# Patient Record
Sex: Female | Born: 1935 | Race: White | Hispanic: No | Smoking: Former smoker
Health system: Southern US, Community
[De-identification: ages and names within clinical notes are randomized; demographics above are authoritative.]

## PROBLEM LIST (undated history)

## (undated) DIAGNOSIS — R238 Other skin changes: Secondary | ICD-10-CM

## (undated) DIAGNOSIS — Z9889 Other specified postprocedural states: Secondary | ICD-10-CM

## (undated) HISTORY — DX: Other specified postprocedural states: Z98.890

## (undated) HISTORY — DX: Other skin changes: R23.8

---

## 1998-02-22 HISTORY — PX: COLONOSCOPY: SHX174

## 1998-02-23 LAB — HM COLONOSCOPY: HM COLON: NORMAL

## 2006-02-22 DIAGNOSIS — Z9889 Other specified postprocedural states: Secondary | ICD-10-CM

## 2006-02-22 HISTORY — PX: EXPLORATORY LAPAROTOMY: SUR591

## 2006-02-22 HISTORY — DX: Other specified postprocedural states: Z98.890

## 2006-12-14 ENCOUNTER — Ambulatory Visit: Payer: Self-pay | Admitting: Emergency Medicine

## 2010-11-24 ENCOUNTER — Ambulatory Visit: Payer: Self-pay | Admitting: Internal Medicine

## 2011-05-30 ENCOUNTER — Ambulatory Visit: Payer: Self-pay | Admitting: Internal Medicine

## 2011-09-25 ENCOUNTER — Ambulatory Visit: Payer: Self-pay | Admitting: Internal Medicine

## 2012-03-03 DIAGNOSIS — M19019 Primary osteoarthritis, unspecified shoulder: Secondary | ICD-10-CM | POA: Insufficient documentation

## 2012-12-17 ENCOUNTER — Ambulatory Visit: Payer: Self-pay | Admitting: Family Medicine

## 2014-03-07 LAB — CBC AND DIFFERENTIAL: Hemoglobin: 12.4 g/dL (ref 12.0–16.0)

## 2014-03-07 LAB — BASIC METABOLIC PANEL
BUN: 13 mg/dL (ref 4–21)
CREATININE: 0.6 mg/dL (ref <=1.1)

## 2014-03-07 LAB — TSH: TSH: 1.6 u[IU]/mL (ref <=5.90)

## 2014-08-23 ENCOUNTER — Encounter: Payer: Self-pay | Admitting: Internal Medicine

## 2014-08-23 DIAGNOSIS — F17201 Nicotine dependence, unspecified, in remission: Secondary | ICD-10-CM | POA: Insufficient documentation

## 2014-08-23 DIAGNOSIS — M549 Dorsalgia, unspecified: Secondary | ICD-10-CM

## 2014-08-23 DIAGNOSIS — K5909 Other constipation: Secondary | ICD-10-CM | POA: Insufficient documentation

## 2014-08-23 DIAGNOSIS — N3941 Urge incontinence: Secondary | ICD-10-CM | POA: Insufficient documentation

## 2014-08-23 DIAGNOSIS — M159 Polyosteoarthritis, unspecified: Secondary | ICD-10-CM | POA: Insufficient documentation

## 2014-08-23 DIAGNOSIS — M810 Age-related osteoporosis without current pathological fracture: Secondary | ICD-10-CM | POA: Insufficient documentation

## 2014-08-23 DIAGNOSIS — M109 Gout, unspecified: Secondary | ICD-10-CM | POA: Insufficient documentation

## 2014-08-23 DIAGNOSIS — I10 Essential (primary) hypertension: Secondary | ICD-10-CM | POA: Insufficient documentation

## 2014-08-23 DIAGNOSIS — G8929 Other chronic pain: Secondary | ICD-10-CM | POA: Insufficient documentation

## 2014-08-23 DIAGNOSIS — K219 Gastro-esophageal reflux disease without esophagitis: Secondary | ICD-10-CM | POA: Insufficient documentation

## 2014-08-23 DIAGNOSIS — F411 Generalized anxiety disorder: Secondary | ICD-10-CM | POA: Insufficient documentation

## 2014-08-27 ENCOUNTER — Ambulatory Visit (INDEPENDENT_AMBULATORY_CARE_PROVIDER_SITE_OTHER): Payer: Medicare Other | Admitting: Internal Medicine

## 2014-08-27 ENCOUNTER — Encounter: Payer: Self-pay | Admitting: Internal Medicine

## 2014-08-27 VITALS — BP 122/64 | HR 60 | Ht 60.0 in | Wt 125.0 lb

## 2014-08-27 DIAGNOSIS — G2581 Restless legs syndrome: Secondary | ICD-10-CM | POA: Diagnosis not present

## 2014-08-27 MED ORDER — ROPINIROLE HCL 1 MG PO TABS: 1.0000 mg | ORAL_TABLET | Freq: Every day | ORAL | Status: DC

## 2014-08-27 NOTE — Progress Notes (Signed)
Date:  08/27/2014   Name:  Joan Singleton   DOB:  14-Sep-1924   MRN:  [id number]   Chief Complaint: No chief complaint on file. Leg Pain  The incident occurred more than 1 week ago. There was no injury mechanism. The pain is present in the left hip. The quality of the pain is described as aching (has twitching in leg when she tries to sleep). The patient is experiencing no pain. The pain has been constant since onset. Exacerbated by: lying down. The treatment provided no relief.  She has the sensation of needing to move her left leg and has to get up and walk.  Usually lasts about an hour before she gets relief.  It does not wake her up from sleep.   Review of Systems:  Review of Systems  Constitutional: Negative for fever and fatigue.  Respiratory: Negative for cough and shortness of breath.   Cardiovascular: Negative for chest pain, palpitations and leg swelling.  Musculoskeletal: Positive for arthralgias and gait problem. Negative for back pain and joint swelling.  Neurological: Negative for dizziness.  Hematological: Negative for adenopathy. Does not bruise/bleed easily.  Psychiatric/Behavioral: Positive for sleep disturbance. The patient is nervous/anxious.     Patient Active Problem List   Diagnosis Date Noted  . Arthritis urica 08/23/2014  . Back pain, chronic 08/23/2014  . Chronic constipation 08/23/2014  . Essential (primary) hypertension 08/23/2014  . Acid reflux 08/23/2014  . Anxiety, generalized 08/23/2014  . Generalized OA 08/23/2014  . OP (osteoporosis) 08/23/2014  . Urge incontinence 08/23/2014  . Compulsive tobacco user syndrome 08/23/2014  . Arthritis of shoulder region, degenerative 03/03/2012  . Arthritis of knee 02/25/2012    Prior to Admission medications   Medication Sig Start Date End Date Taking? Authorizing Provider  amLODipine (NORVASC) 5 MG tablet Take 1 tablet by mouth daily. 08/14/14  Yes Historical Provider, MD  atenolol (TENORMIN) 50 MG tablet Take  1 tablet by mouth daily. 08/14/14  Yes Historical Provider, MD  citalopram (CELEXA) 20 MG tablet Take 1 tablet by mouth daily. 08/14/14  Yes Historical Provider, MD  lisinopril-hydrochlorothiazide (PRINZIDE,ZESTORETIC) 20-25 MG per tablet Take 1 tablet by mouth daily. 08/14/14  Yes Historical Provider, MD  LORazepam (ATIVAN) 0.5 MG tablet Take 1 tablet by mouth 3 (three) times daily. 08/03/14  Yes Historical Provider, MD  omeprazole (PRILOSEC) 20 MG capsule Take 1 capsule by mouth daily. 05/30/14  Yes Historical Provider, MD  oxybutynin (DITROPAN) 5 MG tablet Take 1 tablet by mouth 3 (three) times daily. 05/30/14  Yes Historical Provider, MD  traMADol Janean Sark(ULTRAM) 50 MG tablet Take 2 tablets by mouth QID. 06/14/14  Yes Historical Provider, MD    No Known Allergies  Past Surgical History  Procedure Laterality Date  . Exploratory laparotomy  2008    adhesions  . Colonoscopy  2000    normal    History  Substance Use Topics  . Smoking status: Former Games developermoker  . Smokeless tobacco: Not on file  . Alcohol Use: 1.2 oz/week    2 Standard drinks or equivalent per week     Medication list has been reviewed and updated.  Physical Examination:  Physical Exam  Constitutional: She appears well-developed and well-nourished. No distress.  HENT:  Right Ear: Decreased hearing is noted.  Left Ear: Decreased hearing is noted.  Eyes: Conjunctivae are normal. No scleral icterus.  Neck: Normal range of motion. Neck supple. No thyromegaly present.  Cardiovascular: Normal rate, regular rhythm and normal heart sounds.  Pulmonary/Chest: Effort normal and breath sounds normal. She has no wheezes.  Musculoskeletal:       Right hip: She exhibits no tenderness, no bony tenderness and no swelling.       Left hip: She exhibits no tenderness, no bony tenderness and no swelling.       Right knee: She exhibits normal range of motion. No tenderness found.       Left knee: She exhibits normal range of motion.        Legs: No myalgia to palpation of either thigh or calf bilaterally  Lymphadenopathy:    She has no cervical adenopathy.  Psychiatric: She has a normal mood and affect. Her speech is normal.    BP 122/64 mmHg  Pulse 60  Ht 5' (1.524 m)  Wt 125 lb (56.7 kg)  BMI 24.41 kg/m2  Assessment and Plan: 1. Restless leg syndrome Begin medication 3 hours before bedtime Can increase dose if needed Call for mail order Rx if helpful - rOPINIRole (REQUIP) 1 MG tablet; Take 1 tablet (1 mg total) by mouth at bedtime.  Dispense: 30 tablet; Refill: 1   Bari Edward, MD [facility]  08/27/2014

## 2014-09-05 ENCOUNTER — Other Ambulatory Visit: Payer: Self-pay | Admitting: Internal Medicine

## 2014-10-18 ENCOUNTER — Other Ambulatory Visit: Payer: Self-pay | Admitting: Internal Medicine

## 2014-11-05 ENCOUNTER — Other Ambulatory Visit: Payer: Self-pay | Admitting: Internal Medicine

## 2014-11-22 ENCOUNTER — Other Ambulatory Visit: Payer: Self-pay | Admitting: Internal Medicine

## 2014-11-29 ENCOUNTER — Other Ambulatory Visit: Payer: Self-pay | Admitting: Internal Medicine

## 2014-12-16 ENCOUNTER — Other Ambulatory Visit: Payer: Self-pay

## 2014-12-16 MED ORDER — MECLIZINE HCL 25 MG PO TABS: 25.0000 mg | ORAL_TABLET | Freq: Three times a day (TID) | ORAL | Status: DC | PRN

## 2015-01-27 ENCOUNTER — Other Ambulatory Visit: Payer: Self-pay

## 2015-01-27 DIAGNOSIS — R0789 Other chest pain: Secondary | ICD-10-CM | POA: Insufficient documentation

## 2015-01-27 DIAGNOSIS — R0602 Shortness of breath: Secondary | ICD-10-CM | POA: Insufficient documentation

## 2015-01-27 DIAGNOSIS — Z8781 Personal history of (healed) traumatic fracture: Secondary | ICD-10-CM | POA: Insufficient documentation

## 2015-02-05 ENCOUNTER — Inpatient Hospital Stay: Payer: Medicare Other | Admitting: Internal Medicine

## 2015-02-06 ENCOUNTER — Other Ambulatory Visit: Payer: Self-pay | Admitting: Internal Medicine

## 2015-02-10 ENCOUNTER — Encounter: Payer: Self-pay | Admitting: Internal Medicine

## 2015-02-10 ENCOUNTER — Ambulatory Visit (INDEPENDENT_AMBULATORY_CARE_PROVIDER_SITE_OTHER): Payer: Medicare Other | Admitting: Internal Medicine

## 2015-02-10 VITALS — BP 110/68 | HR 64 | Ht 60.0 in

## 2015-02-10 DIAGNOSIS — R0789 Other chest pain: Secondary | ICD-10-CM | POA: Diagnosis not present

## 2015-02-10 DIAGNOSIS — M159 Polyosteoarthritis, unspecified: Secondary | ICD-10-CM | POA: Diagnosis not present

## 2015-02-10 NOTE — Progress Notes (Signed)
Date:  02/10/2015   Name:  Joan Singleton   DOB:  [birth year]   MRN:  [id number]   Chief Complaint: Hospitalization Follow-up Patient presents for hospital follow up from [facility].  Admitted 01/26/15 and discharged 01/29/15 for aspiration with LOC.  She received CPR causing rib fractures.  CXR was suspicious for aspiration but she stabilized and no antibiotics were needed.  She is taking tramadol as needed.  She had some chest discomfort but seems to be slowly improving.  Blood work was normal except for chronically low sodium. According to the daughter, the patient wishes to be a DO NOT RESUSCITATE which is now established at [facility]. She would like to have the paper for home documentation.  Review of Systems  Constitutional: Positive for appetite change. Negative for chills, diaphoresis and fatigue.  Respiratory: Negative for apnea, cough, shortness of breath and wheezing.   Cardiovascular: Positive for chest pain. Negative for palpitations and leg swelling.  Gastrointestinal: Negative for abdominal pain.  Musculoskeletal: Positive for arthralgias.  Neurological: Negative for dizziness, light-headedness and headaches.  Psychiatric/Behavioral: Negative for agitation. The patient is not nervous/anxious.     Patient Active Problem List   Diagnosis Date Noted  . Breath shortness 01/27/2015  . Personal history of healed traumatic fracture 01/27/2015  . Chest wall pain 01/27/2015  . Arthritis urica 08/23/2014  . Back pain, chronic 08/23/2014  . Chronic constipation 08/23/2014  . Essential (primary) hypertension 08/23/2014  . Acid reflux 08/23/2014  . Anxiety, generalized 08/23/2014  . Generalized OA 08/23/2014  . OP (osteoporosis) 08/23/2014  . Urge incontinence 08/23/2014  . Compulsive tobacco user syndrome 08/23/2014  . Arthritis of shoulder region, degenerative 03/03/2012  . Arthritis of knee 02/25/2012    Prior to Admission medications   Medication Sig Start Date End Date Taking?  Authorizing Provider  amLODipine (NORVASC) 5 MG tablet Take 1 tablet by mouth daily. 08/14/14  Yes Historical Provider, MD  atenolol (TENORMIN) 50 MG tablet Take 1 tablet by mouth daily. 08/14/14  Yes Historical Provider, MD  citalopram (CELEXA) 20 MG tablet Take 1 tablet by mouth daily. 08/14/14  Yes Historical Provider, MD  lisinopril-hydrochlorothiazide (PRINZIDE,ZESTORETIC) 20-25 MG per tablet Take 1 tablet by mouth daily. 08/14/14  Yes Historical Provider, MD  LORazepam (ATIVAN) 0.5 MG tablet TAKE 1 TABLET BY MOUTH THREE TIMES DAILY AS NEEDED 11/29/14  Yes Reubin Milan, MD  meclizine (ANTIVERT) 25 MG tablet TAKE 1 TABLET(25 MG) BY MOUTH THREE TIMES DAILY AS NEEDED FOR DIZZINESS 02/06/15  Yes Reubin Milan, MD  omeprazole (PRILOSEC) 20 MG capsule Take 1 capsule by mouth daily. 05/30/14  Yes Historical Provider, MD  oxybutynin (DITROPAN) 5 MG tablet TAKE 1 BY MOUTH 3 TIMES DAILY 09/05/14  Yes Reubin Milan, MD  rOPINIRole (REQUIP) 1 MG tablet TAKE 1 TABLET(1 MG) BY MOUTH AT BEDTIME 10/19/14  Yes Reubin Milan, MD  traMADol (ULTRAM) 50 MG tablet TAKE 2 BY MOUTH FOUR TIMES DAILY 11/22/14  Yes Reubin Milan, MD  colchicine (COLCRYS) 0.6 MG tablet Reported on 02/10/2015 02/18/12   Historical Provider, MD    No Known Allergies  Past Surgical History  Procedure Laterality Date  . Exploratory laparotomy  2008    adhesions  . Colonoscopy  2000    normal    Social History  Substance Use Topics  . Smoking status: Former Games developer  . Smokeless tobacco: None  . Alcohol Use: 1.2 oz/week    2 Standard drinks or equivalent per week  XR CHEST SINGLE VIEW PORTABLE  Indication: T17.900A Unspecified foreign body in respiratory tract, part unspecified causing asphyxiation, initial encounter, Lung Aeration  Comparison: 01/29/2013 chest radiograph  Findings/impression: Stable cardiomegaly and mediastinal contours. Similar appearance of aortic tortuosity and peripheral calcification. No  pneumothorax or large pleural effusions.  Heterogeneous opacities over bilateral lower lungs, aspiration/infection versus edema.  Chest radiograph is dedicated to evaluating lung parenchyma, osseous structure is not well visualized, especially anterior ribs and the sternum. If clinically concerned, cross-sectional imaging can be used for further assessing osseous abnormalities.   These impressions were discussed with Dr. Robb Matarrtiz on 01/26/2015 at 2110 hours by Dr. Kristeen MissLinnan Tang.  Electronically Reviewed by:  Kristeen MissLinnan Tang, PhD, MD Electronically Reviewed on:  01/27/2015 9:13 AM  I have reviewed the images and concur with the above findings.  Electronically Signed by:  Mena PaulsLynne Hurwitz, MD Electronically Signed on:  01/27/2015 9:39 AM  CT Chest without contrast  Indication: T17.900A Unspecified foreign body in respiratory tract, part unspecified causing asphyxiation, initial encounter, R07.89 Other chest pain, CHOKING, eval for rib fractures s/p CPR  Compare: 01/02/2007  Technique: Volumetric non-contrast chest CT acquisition was performed from the lower neck to the adrenal glands. 1.25 mm axial, 5 mm axial, 3 mm coronal, and 3 mm sagittal reconstructions were performed. 3D axial maximum intensity projection images (MIPS) were reconstructed to facilitate lung nodule detection.  Findings:  No effusion or pneumothorax. The central airways are patent. There is dependent atelectasis of the right lower lobe. There is scarring of the bilateral lung apices. There are scattered areas of ground glass within the right lower lobe. Stable 6 mm right upper lobe pulmonary nodule series 4 image 76. No concerning pulmonary nodules are seen.  The heart size normal with no pericardial effusion. There is severe three-vessel coronary artery calcifications. There are aortic valve calcifications. The pulmonary artery is mildly enlarged up to 3.3 cm as can be seen in pulmonary arterial hypertension. The  thoracic aorta is severely tortuous with multifocal aortic calcifications.  Normal thyroid. There is a mildly dilated esophagus. Evaluation of the upper abdomen is by lack of intravenous contrast. There is multilevel severe degenerative change of the thoracic spine. There is nondisplaced fracture of the mid sternum with buckling of the anterior cortex. There is mild L1-L2 retrolisthesis and multilevel anterolisthesis of the upper thoracic spine which is likely degenerative. There is diffuse osteopenia.  Right-sided rib fractures include anterior right second, age-indeterminate anterior right third, age-indeterminate anterior right fourth, age-indeterminate anterior right fifth, and age-indeterminate anterior right sixth. Left-sided rib fractures include displaced anterior first, nondisplaced anterior second, nondisplaced anterior third, nondisplaced anterior fifth, and nondisplaced anterior sixth. There are severe degenerative changes of the bilateral glenohumeral joints.  Impression:  1. Nondisplaced fracture of the mid sternum with buckling of the anterior cortex. No hemopericardium or pneumothorax.  2. Scattered groundglass opacities of the right lower lobe may be secondary to aspiration given the reported history of choking and vomiting.  3. Multiple bilateral rib fractures as described above.   The preliminary report (critical or emergent communication) was reviewed prior to this dictation and there are no substantial differences between the preliminary results and the impressions in this final report. These findings were discussed with the patient see provider by Dr. Deneise LeverHarring is via telephone on 01/26/2015 at 11:39 PM.   Electronically Reviewed by: Samule OhmScott Harring, MD Electronically Reviewed on: 01/27/2015 8:49 AM   Medication list has been reviewed and updated.   Physical Exam  Constitutional: She appears well-developed.  No distress.  Neck: Normal range of motion. Neck  supple.  Cardiovascular: Normal rate, regular rhythm and normal heart sounds.   Pulmonary/Chest: Effort normal and breath sounds normal. No respiratory distress. She has no wheezes. She exhibits tenderness (anterior upper chest).  Musculoskeletal: She exhibits no edema.  Neurological: She is alert.  Psychiatric: She has a normal mood and affect.  Nursing note and vitals reviewed.   BP 110/68 mmHg  Pulse 64  Ht 5' (1.524 m)  Assessment and Plan: 1. Chest wall pain Secondary to multiple anterior rib fractures Discomfort is slowly improving; continue tramadol  2. Generalized OA Unchanged  DNR sheet is provided.  Bari Edward, MD [facility]  02/10/2015

## 2015-02-14 ENCOUNTER — Other Ambulatory Visit: Payer: Self-pay | Admitting: Internal Medicine

## 2015-02-27 ENCOUNTER — Other Ambulatory Visit: Payer: Self-pay | Admitting: Internal Medicine

## 2015-03-07 ENCOUNTER — Telehealth: Payer: Self-pay

## 2015-03-07 NOTE — Telephone Encounter (Signed)
[facility] with [facility] called [facility] today requesting verbal order for PT and HomeHealth twice weekly for next 3 weeks. Verbal order was given.   Thanks

## 2015-03-17 ENCOUNTER — Other Ambulatory Visit: Payer: Self-pay | Admitting: Internal Medicine

## 2015-04-02 DIAGNOSIS — S2220XD Unspecified fracture of sternum, subsequent encounter for fracture with routine healing: Secondary | ICD-10-CM | POA: Diagnosis not present

## 2015-04-02 DIAGNOSIS — M1991 Primary osteoarthritis, unspecified site: Secondary | ICD-10-CM | POA: Diagnosis not present

## 2015-04-02 DIAGNOSIS — I10 Essential (primary) hypertension: Secondary | ICD-10-CM | POA: Diagnosis not present

## 2015-04-02 DIAGNOSIS — S2242XD Multiple fractures of ribs, left side, subsequent encounter for fracture with routine healing: Secondary | ICD-10-CM | POA: Diagnosis not present

## 2015-04-02 DIAGNOSIS — Z9181 History of falling: Secondary | ICD-10-CM | POA: Diagnosis not present

## 2015-04-02 DIAGNOSIS — S2231XD Fracture of one rib, right side, subsequent encounter for fracture with routine healing: Secondary | ICD-10-CM | POA: Diagnosis not present

## 2015-04-02 DIAGNOSIS — M109 Gout, unspecified: Secondary | ICD-10-CM | POA: Diagnosis not present

## 2015-04-02 DIAGNOSIS — S2243XA Multiple fractures of ribs, bilateral, initial encounter for closed fracture: Secondary | ICD-10-CM | POA: Diagnosis not present

## 2015-04-02 DIAGNOSIS — F0390 Unspecified dementia without behavioral disturbance: Secondary | ICD-10-CM | POA: Diagnosis not present

## 2015-05-22 ENCOUNTER — Other Ambulatory Visit: Payer: Self-pay | Admitting: Internal Medicine

## 2015-05-23 ENCOUNTER — Other Ambulatory Visit: Payer: Self-pay

## 2015-05-23 MED ORDER — ROPINIROLE HCL 1 MG PO TABS: 1.0000 mg | ORAL_TABLET | Freq: Every day | ORAL | Status: DC

## 2015-05-23 MED ORDER — CITALOPRAM HYDROBROMIDE 20 MG PO TABS: ORAL_TABLET | ORAL | Status: DC

## 2015-05-23 MED ORDER — LISINOPRIL-HYDROCHLOROTHIAZIDE 20-25 MG PO TABS: ORAL_TABLET | ORAL | Status: DC

## 2015-05-23 MED ORDER — OXYBUTYNIN CHLORIDE 5 MG PO TABS: ORAL_TABLET | ORAL | Status: DC

## 2015-05-23 MED ORDER — AMLODIPINE BESYLATE 5 MG PO TABS: ORAL_TABLET | ORAL | Status: DC

## 2015-05-23 MED ORDER — ATENOLOL 50 MG PO TABS: ORAL_TABLET | ORAL | Status: DC

## 2015-05-23 MED ORDER — OMEPRAZOLE 20 MG PO CPDR: DELAYED_RELEASE_CAPSULE | ORAL | Status: DC

## 2015-05-23 NOTE — Telephone Encounter (Signed)
Patient called stating that she has a new mail order pharmacy and would like medication sent in.

## 2015-05-28 ENCOUNTER — Other Ambulatory Visit: Payer: Self-pay

## 2015-05-28 MED ORDER — TRAMADOL HCL 50 MG PO TABS: ORAL_TABLET | ORAL | Status: DC

## 2015-05-28 NOTE — Telephone Encounter (Signed)
Patient called in states that she needs to get a refill of her Tramadol sent to Mail order.

## 2015-05-30 ENCOUNTER — Encounter (INDEPENDENT_AMBULATORY_CARE_PROVIDER_SITE_OTHER): Payer: Medicare Other | Admitting: Internal Medicine

## 2015-05-30 DIAGNOSIS — I10 Essential (primary) hypertension: Secondary | ICD-10-CM

## 2015-05-30 DIAGNOSIS — S2249XA Multiple fractures of ribs, unspecified side, initial encounter for closed fracture: Secondary | ICD-10-CM | POA: Insufficient documentation

## 2015-05-30 DIAGNOSIS — S2249XS Multiple fractures of ribs, unspecified side, sequela: Secondary | ICD-10-CM | POA: Diagnosis not present

## 2015-05-30 DIAGNOSIS — M159 Polyosteoarthritis, unspecified: Secondary | ICD-10-CM

## 2015-05-30 NOTE — Progress Notes (Signed)
Patient ID: [name], female   DOB: [birth year], 82 y.o.   MRN: [id number]  Received home health orders from [facility] for re-certification.  Re-certification period from 03/30/2015 through 05/28/2015.   Orders are reviewed, signed and faxed.

## 2015-07-03 DIAGNOSIS — H524 Presbyopia: Secondary | ICD-10-CM | POA: Diagnosis not present

## 2015-09-08 ENCOUNTER — Other Ambulatory Visit: Payer: Self-pay | Admitting: Internal Medicine

## 2015-09-09 NOTE — Telephone Encounter (Signed)
Patient scheduled for a general follow up on 10/03/15 @ 300 pm

## 2015-09-16 ENCOUNTER — Other Ambulatory Visit: Payer: Self-pay

## 2015-09-16 MED ORDER — ATENOLOL 50 MG PO TABS: ORAL_TABLET | ORAL | 0 refills | Status: DC

## 2015-10-03 ENCOUNTER — Encounter: Payer: Self-pay | Admitting: Internal Medicine

## 2015-10-03 ENCOUNTER — Ambulatory Visit (INDEPENDENT_AMBULATORY_CARE_PROVIDER_SITE_OTHER): Payer: Medicare Other | Admitting: Internal Medicine

## 2015-10-03 VITALS — BP 98/78 | HR 60 | Resp 16 | Ht 60.0 in | Wt 131.0 lb

## 2015-10-03 DIAGNOSIS — M549 Dorsalgia, unspecified: Secondary | ICD-10-CM | POA: Diagnosis not present

## 2015-10-03 DIAGNOSIS — I10 Essential (primary) hypertension: Secondary | ICD-10-CM

## 2015-10-03 DIAGNOSIS — F411 Generalized anxiety disorder: Secondary | ICD-10-CM

## 2015-10-03 DIAGNOSIS — G8929 Other chronic pain: Secondary | ICD-10-CM | POA: Diagnosis not present

## 2015-10-03 DIAGNOSIS — M129 Arthropathy, unspecified: Secondary | ICD-10-CM

## 2015-10-03 DIAGNOSIS — M1710 Unilateral primary osteoarthritis, unspecified knee: Secondary | ICD-10-CM

## 2015-10-03 MED ORDER — METOPROLOL SUCCINATE ER 50 MG PO TB24
50.0000 mg | ORAL_TABLET | Freq: Every day | ORAL | 3 refills | Status: DC
Start: 2015-10-03 — End: 2016-05-25

## 2015-10-03 MED ORDER — LORAZEPAM 0.5 MG PO TABS: 0.5000 mg | ORAL_TABLET | Freq: Three times a day (TID) | ORAL | 5 refills | Status: DC | PRN

## 2015-10-03 MED ORDER — TRAMADOL HCL 50 MG PO TABS: ORAL_TABLET | ORAL | 1 refills | Status: DC

## 2015-10-03 NOTE — Patient Instructions (Signed)
[facility] -

## 2015-10-03 NOTE — Progress Notes (Signed)
Date:  10/03/2015   Name:  Joan Singleton   DOB:  03/24/1924   MRN:  [id number]   Chief Complaint: Hypertension; Anxiety; Arthritis (OA); and Restless leg Syndrome Hypertension  This is a chronic problem. The current episode started more than 1 year ago. The problem is unchanged. Associated symptoms include anxiety. Pertinent negatives include no chest pain, headaches, palpitations or shortness of breath. Past treatments include ACE inhibitors, diuretics and calcium channel blockers. The current treatment provides significant improvement. There are no compliance problems.   Anxiety  Presents for follow-up visit. Symptoms include depressed mood and nervous/anxious behavior. Patient reports no chest pain, decreased concentration, dizziness, palpitations or shortness of breath. Symptoms occur most days. The severity of symptoms is interfering with daily activities. The quality of sleep is fair.    Arthritis  Presents for follow-up visit. She complains of pain and stiffness. She reports no joint swelling. The symptoms have been stable. Affected locations include the right knee and left knee. Pertinent negatives include no dysuria, fatigue, fever or rash.      Review of Systems  Constitutional: Negative for chills, fatigue and fever.  HENT: Positive for dental problem and hearing loss. Negative for congestion, sore throat, trouble swallowing and voice change.   Eyes: Negative for visual disturbance.  Respiratory: Negative for cough, chest tightness and shortness of breath.   Cardiovascular: Negative for chest pain, palpitations and leg swelling.  Gastrointestinal: Positive for constipation. Negative for abdominal pain.  Genitourinary: Negative for dysuria.  Musculoskeletal: Positive for arthralgias, arthritis, back pain, gait problem and stiffness. Negative for joint swelling.  Skin: Negative for color change and rash.  Neurological: Negative for dizziness and headaches.  Hematological:  Negative for adenopathy.  Psychiatric/Behavioral: Negative for decreased concentration. The patient is nervous/anxious.     Patient Active Problem List   Diagnosis Date Noted  . Multiple rib fractures 05/30/2015  . Breath shortness 01/27/2015  . Personal history of healed traumatic fracture 01/27/2015  . Chest wall pain 01/27/2015  . Arthritis urica 08/23/2014  . Back pain, chronic 08/23/2014  . Chronic constipation 08/23/2014  . Essential (primary) hypertension 08/23/2014  . Acid reflux 08/23/2014  . Anxiety, generalized 08/23/2014  . Generalized OA 08/23/2014  . OP (osteoporosis) 08/23/2014  . Urge incontinence 08/23/2014  . Tobacco use disorder, mild, in sustained remission 08/23/2014  . Arthritis of knee 02/25/2012    Prior to Admission medications   Medication Sig Start Date End Date Taking? Authorizing Provider  amLODipine (NORVASC) 5 MG tablet TAKE 1 BY MOUTH DAILY 05/23/15  Yes Reubin Milan, MD  atenolol (TENORMIN) 50 MG tablet TAKE 1 BY MOUTH DAILY 09/16/15  Yes Reubin Milan, MD  citalopram (CELEXA) 20 MG tablet TAKE 1 BY MOUTH DAILY 05/23/15  Yes Reubin Milan, MD  colchicine (COLCRYS) 0.6 MG tablet Reported on 02/10/2015 02/18/12  Yes Historical Provider, MD  lisinopril-hydrochlorothiazide (PRINZIDE,ZESTORETIC) 20-25 MG tablet TAKE 1 BY MOUTH DAILY 05/23/15  Yes Reubin Milan, MD  LORazepam (ATIVAN) 0.5 MG tablet TAKE 1 TABLET BY MOUTH THREE TIMES DAILY AS NEEDED 09/08/15  Yes Reubin Milan, MD  meclizine (ANTIVERT) 25 MG tablet TAKE 1 TABLET(25 MG) BY MOUTH THREE TIMES DAILY AS NEEDED FOR DIZZINESS 02/27/15  Yes Reubin Milan, MD  omeprazole (PRILOSEC) 20 MG capsule TAKE 1 BY MOUTH DAILY 05/23/15  Yes Reubin Milan, MD  oxybutynin (DITROPAN) 5 MG tablet TAKE 1 BY MOUTH 3 TIMES DAILY 05/23/15  Yes Reubin Milan, MD  rOPINIRole (REQUIP) 1 MG tablet Take 1 tablet (1 mg total) by mouth at bedtime. 05/23/15  Yes Reubin MilanLaura H Yennifer Segovia, MD  traMADol (ULTRAM) 50 MG  tablet TAKE 2 BY MOUTH FOUR TIMES DAILY 05/28/15  Yes Reubin MilanLaura H Tabor Bartram, MD    No Known Allergies  Past Surgical History:  Procedure Laterality Date  . COLONOSCOPY  2000   normal  . EXPLORATORY LAPAROTOMY  2008   adhesions    Social History  Substance Use Topics  . Smoking status: Former Games developermoker  . Smokeless tobacco: Not on file  . Alcohol use 1.2 oz/week    2 Standard drinks or equivalent per week     Medication list has been reviewed and updated.   Physical Exam  Constitutional: She is oriented to person, place, and time. She appears well-developed. No distress.  HENT:  Head: Normocephalic and atraumatic.  Neck: Normal range of motion. No thyromegaly present.  Cardiovascular: Normal rate, regular rhythm and normal heart sounds.   No murmur heard. Pulmonary/Chest: Effort normal and breath sounds normal. No respiratory distress. She has no wheezes.  Abdominal: Soft. Bowel sounds are normal. There is no tenderness.  Musculoskeletal: She exhibits no edema.       Right wrist: She exhibits decreased range of motion and bony tenderness.       Left wrist: She exhibits decreased range of motion and bony tenderness.       Right knee: She exhibits effusion. She exhibits normal range of motion. Tenderness found.       Left knee: She exhibits normal range of motion and no effusion. Tenderness found.  Neurological: She is alert and oriented to person, place, and time. She has normal strength.  Skin: Skin is warm and dry. No rash noted.  Psychiatric: She has a normal mood and affect. Her speech is normal and behavior is normal. Thought content normal.    BP 98/78 (BP Location: Left Arm, Patient Position: Sitting, Cuff Size: Small)   Pulse 60   Resp 16   Ht 5' (1.524 m)   Wt 131 lb (59.4 kg)   SpO2 97%   BMI 25.58 kg/m   Assessment and Plan: 1. Essential (primary) hypertension Unable to get atenolol so will change to toprol - metoprolol succinate (TOPROL-XL) 50 MG 24 hr tablet;  Take 1 tablet (50 mg total) by mouth daily. Take with or immediately following a meal.  Dispense: 90 tablet; Refill: 3  2. Arthritis of knee Continue tylenol and tramadol  3. Anxiety, generalized controlled - LORazepam (ATIVAN) 0.5 MG tablet; Take 1 tablet (0.5 mg total) by mouth 3 (three) times daily as needed.  Dispense: 90 tablet; Refill: 5  4. Back pain, chronic Stable; uses Rollator at home - traMADol (ULTRAM) 50 MG tablet; TAKE 2 BY MOUTH FOUR TIMES DAILY  Dispense: 720 tablet; Refill: 1  -[facility] since transportation out of the house is difficult.  Bari EdwardLaura  Nihiser, MD [facility]  10/03/2015

## 2016-05-15 ENCOUNTER — Other Ambulatory Visit: Payer: Self-pay | Admitting: Internal Medicine

## 2016-05-15 DIAGNOSIS — F411 Generalized anxiety disorder: Secondary | ICD-10-CM

## 2016-05-18 ENCOUNTER — Ambulatory Visit: Payer: Medicare Other | Admitting: Internal Medicine

## 2016-05-25 ENCOUNTER — Ambulatory Visit (INDEPENDENT_AMBULATORY_CARE_PROVIDER_SITE_OTHER): Payer: Medicare Other | Admitting: Internal Medicine

## 2016-05-25 ENCOUNTER — Encounter: Payer: Self-pay | Admitting: Internal Medicine

## 2016-05-25 VITALS — BP 94/60 | HR 74 | Ht 60.0 in | Wt 125.0 lb

## 2016-05-25 DIAGNOSIS — F411 Generalized anxiety disorder: Secondary | ICD-10-CM

## 2016-05-25 DIAGNOSIS — I10 Essential (primary) hypertension: Secondary | ICD-10-CM | POA: Diagnosis not present

## 2016-05-25 DIAGNOSIS — G2581 Restless legs syndrome: Secondary | ICD-10-CM | POA: Insufficient documentation

## 2016-05-25 DIAGNOSIS — K219 Gastro-esophageal reflux disease without esophagitis: Secondary | ICD-10-CM | POA: Diagnosis not present

## 2016-05-25 DIAGNOSIS — G8929 Other chronic pain: Secondary | ICD-10-CM | POA: Diagnosis not present

## 2016-05-25 DIAGNOSIS — M159 Polyosteoarthritis, unspecified: Secondary | ICD-10-CM | POA: Diagnosis not present

## 2016-05-25 DIAGNOSIS — M549 Dorsalgia, unspecified: Secondary | ICD-10-CM | POA: Diagnosis not present

## 2016-05-25 MED ORDER — LORAZEPAM 0.5 MG PO TABS: 0.5000 mg | ORAL_TABLET | Freq: Three times a day (TID) | ORAL | 1 refills | Status: DC | PRN

## 2016-05-25 MED ORDER — TRAMADOL HCL 50 MG PO TABS: ORAL_TABLET | ORAL | 1 refills | Status: DC

## 2016-05-25 MED ORDER — AMLODIPINE BESYLATE 5 MG PO TABS: ORAL_TABLET | ORAL | 3 refills | Status: DC

## 2016-05-25 MED ORDER — ROPINIROLE HCL 1 MG PO TABS: 2.0000 mg | ORAL_TABLET | Freq: Every day | ORAL | 1 refills | Status: DC

## 2016-05-25 MED ORDER — OXYBUTYNIN CHLORIDE 5 MG PO TABS: ORAL_TABLET | ORAL | 3 refills | Status: DC

## 2016-05-25 MED ORDER — CITALOPRAM HYDROBROMIDE 20 MG PO TABS: ORAL_TABLET | ORAL | 3 refills | Status: DC

## 2016-05-25 MED ORDER — OMEPRAZOLE 20 MG PO CPDR: DELAYED_RELEASE_CAPSULE | ORAL | 3 refills | Status: DC

## 2016-05-25 MED ORDER — LISINOPRIL-HYDROCHLOROTHIAZIDE 20-25 MG PO TABS: ORAL_TABLET | ORAL | 3 refills | Status: DC

## 2016-05-25 MED ORDER — METOPROLOL SUCCINATE ER 50 MG PO TB24: 50.0000 mg | ORAL_TABLET | Freq: Every day | ORAL | 3 refills | Status: DC

## 2016-05-25 NOTE — Progress Notes (Signed)
Date:  05/25/2016   Name:  Joan Singleton   DOB:  1924/08/27   MRN:  [id number]   Chief Complaint: restless leg (ropinirole not helping as good as before. Waking up at night due to leg "jitters"); Hypertension (Needs Meds Refilled. ); and Gastroesophageal Reflux Hypertension  This is a chronic problem. The problem is unchanged. The problem is controlled. Associated symptoms include anxiety. Pertinent negatives include no chest pain, headaches or palpitations.  Gastroesophageal Reflux  She complains of tooth decay. She reports no abdominal pain, no chest pain, no choking, no dysphagia, no heartburn or no wheezing. This is a chronic problem. The problem occurs occasionally. Pertinent negatives include no fatigue. She has tried a PPI for the symptoms.  Anxiety  Presents for follow-up visit. Symptoms include nervous/anxious behavior. Patient reports no chest pain, confusion or palpitations. Symptoms occur occasionally. The severity of symptoms is interfering with daily activities. The quality of sleep is fair.    Back Pain  This is a chronic problem. The problem occurs daily. The problem has been waxing and waning since onset. The pain is moderate. The pain is the same all the time. Associated symptoms include leg pain and weakness. Pertinent negatives include no abdominal pain, chest pain, dysuria or headaches. She has tried muscle relaxant, NSAIDs and analgesics (she has required tramadol for pain relief and quality of life) for the symptoms. The treatment provided moderate relief.   RLS - having more RLS sx that seem to keep her from sleeping.  She is not sure if it is late in the night or earlier.  She has been on requip for years.   Review of Systems  Constitutional: Negative for chills and fatigue.  Respiratory: Negative for choking and wheezing.   Cardiovascular: Negative for chest pain, palpitations and leg swelling.  Gastrointestinal: Negative for abdominal pain, dysphagia and  heartburn.  Genitourinary: Negative for dysuria and hematuria.  Musculoskeletal: Positive for arthralgias, back pain and gait problem.  Neurological: Positive for weakness. Negative for tremors, seizures and headaches.  Psychiatric/Behavioral: Positive for sleep disturbance. Negative for confusion and dysphoric mood. The patient is nervous/anxious.     Patient Active Problem List   Diagnosis Date Noted  . Multiple rib fractures 05/30/2015  . Breath shortness 01/27/2015  . Personal history of healed traumatic fracture 01/27/2015  . Chest wall pain 01/27/2015  . Arthritis urica 08/23/2014  . Back pain, chronic 08/23/2014  . Chronic constipation 08/23/2014  . Essential (primary) hypertension 08/23/2014  . Acid reflux 08/23/2014  . Anxiety, generalized 08/23/2014  . Generalized OA 08/23/2014  . OP (osteoporosis) 08/23/2014  . Urge incontinence 08/23/2014  . Tobacco use disorder, mild, in sustained remission 08/23/2014  . Arthritis of knee 02/25/2012    Prior to Admission medications   Medication Sig Start Date End Date Taking? Authorizing Provider  amLODipine (NORVASC) 5 MG tablet TAKE 1 BY MOUTH DAILY 05/23/15  Yes Reubin Milan, MD  citalopram (CELEXA) 20 MG tablet TAKE 1 BY MOUTH DAILY 05/23/15  Yes Reubin Milan, MD  colchicine (COLCRYS) 0.6 MG tablet Reported on 02/10/2015 02/18/12  Yes Historical Provider, MD  lisinopril-hydrochlorothiazide (PRINZIDE,ZESTORETIC) 20-25 MG tablet TAKE 1 BY MOUTH DAILY 05/23/15  Yes Reubin Milan, MD  LORazepam (ATIVAN) 0.5 MG tablet TAKE 1 TABLET BY MOUTH THREE TIMES DAILY AS NEEDED 05/17/16  Yes Reubin Milan, MD  meclizine (ANTIVERT) 25 MG tablet TAKE 1 TABLET(25 MG) BY MOUTH THREE TIMES DAILY AS NEEDED FOR DIZZINESS 02/27/15  Yes  Reubin Milan, MD  metoprolol succinate (TOPROL-XL) 50 MG 24 hr tablet Take 1 tablet (50 mg total) by mouth daily. Take with or immediately following a meal. 10/03/15  Yes Reubin Milan, MD  omeprazole  (PRILOSEC) 20 MG capsule TAKE 1 BY MOUTH DAILY 05/23/15  Yes Reubin Milan, MD  oxybutynin (DITROPAN) 5 MG tablet TAKE 1 BY MOUTH 3 TIMES DAILY 05/23/15  Yes Reubin Milan, MD  rOPINIRole (REQUIP) 1 MG tablet Take 1 tablet (1 mg total) by mouth at bedtime. 05/23/15  Yes Reubin Milan, MD  traMADol (ULTRAM) 50 MG tablet TAKE 2 BY MOUTH FOUR TIMES DAILY 10/03/15  Yes Reubin Milan, MD    No Known Allergies  Past Surgical History:  Procedure Laterality Date  . COLONOSCOPY  2000   normal  . EXPLORATORY LAPAROTOMY  2008   adhesions    Social History  Substance Use Topics  . Smoking status: Former Games developer  . Smokeless tobacco: Never Used  . Alcohol use 1.2 oz/week    2 Standard drinks or equivalent per week     Medication list has been reviewed and updated.   Physical Exam  Constitutional: She is oriented to person, place, and time. She appears well-developed. No distress.  HENT:  Head: Normocephalic and atraumatic.  Nose: Right sinus exhibits no maxillary sinus tenderness. Left sinus exhibits no maxillary sinus tenderness.  Mouth/Throat: Abnormal dentition. No posterior oropharyngeal erythema.  Cardiovascular: Normal rate, regular rhythm and normal heart sounds.   Pulmonary/Chest: Effort normal and breath sounds normal. No respiratory distress. She has no decreased breath sounds. She has no wheezes.  Abdominal: There is no tenderness. There is no CVA tenderness.  Musculoskeletal: Normal range of motion.       Lumbar back: She exhibits tenderness.  Neurological: She is alert and oriented to person, place, and time.  4/5 grips both hands 4/5 strength in both hip flexors  Skin: Skin is warm, dry and intact. No rash noted.  Psychiatric: She has a normal mood and affect. Her speech is normal and behavior is normal. Thought content normal.  Nursing note and vitals reviewed.   BP 94/60 (BP Location: Right Arm, Patient Position: Sitting, Cuff Size: Normal)   Pulse 74   Ht  5' (1.524 m)   Wt 125 lb (56.7 kg) Comment: daughter reported weight. In wheelchair.  SpO2 96%   BMI 24.41 kg/m   Assessment and Plan: 1. Anxiety, generalized Continue celexa and ativan - LORazepam (ATIVAN) 0.5 MG tablet; Take 1 tablet (0.5 mg total) by mouth 3 (three) times daily as needed.  Dispense: 90 tablet; Refill: 1  2. Essential (primary) hypertension controlled - metoprolol succinate (TOPROL-XL) 50 MG 24 hr tablet; Take 1 tablet (50 mg total) by mouth daily. Take with or immediately following a meal.  Dispense: 90 tablet; Refill: 3 - CBC with Differential/Platelet - Comprehensive metabolic panel - TSH  3. Other chronic back pain Continues on high dose tramadol - family accepts risk due to significant improvement in quality of life - traMADol (ULTRAM) 50 MG tablet; TAKE 2 BY MOUTH FOUR TIMES DAILY  Dispense: 720 tablet; Refill: 1  4. Generalized OA Stable - needs more supervised ambulation  5. Restless leg syndrome Increase dose of requip and administer 1 hour before bedtime  6. Gastroesophageal reflux disease, esophagitis presence not specified controlled   Meds ordered this encounter  Medications  . rOPINIRole (REQUIP) 1 MG tablet    Sig: Take 2 tablets (2 mg total)  by mouth at bedtime.    Dispense:  180 tablet    Refill:  1  . DISCONTD: LORazepam (ATIVAN) 0.5 MG tablet    Sig: Take 1 tablet (0.5 mg total) by mouth 3 (three) times daily as needed.    Dispense:  90 tablet    Refill:  1  . amLODipine (NORVASC) 5 MG tablet    Sig: TAKE 1 BY MOUTH DAILY    Dispense:  90 tablet    Refill:  3  . citalopram (CELEXA) 20 MG tablet    Sig: TAKE 1 BY MOUTH DAILY    Dispense:  90 tablet    Refill:  3  . lisinopril-hydrochlorothiazide (PRINZIDE,ZESTORETIC) 20-25 MG tablet    Sig: TAKE 1 BY MOUTH DAILY    Dispense:  90 tablet    Refill:  3  . metoprolol succinate (TOPROL-XL) 50 MG 24 hr tablet    Sig: Take 1 tablet (50 mg total) by mouth daily. Take with or  immediately following a meal.    Dispense:  90 tablet    Refill:  3  . omeprazole (PRILOSEC) 20 MG capsule    Sig: TAKE 1 BY MOUTH DAILY    Dispense:  90 capsule    Refill:  3  . oxybutynin (DITROPAN) 5 MG tablet    Sig: TAKE 1 BY MOUTH 3 TIMES DAILY    Dispense:  270 tablet    Refill:  3  . DISCONTD: traMADol (ULTRAM) 50 MG tablet    Sig: TAKE 2 BY MOUTH FOUR TIMES DAILY    Dispense:  720 tablet    Refill:  1  . LORazepam (ATIVAN) 0.5 MG tablet    Sig: Take 1 tablet (0.5 mg total) by mouth 3 (three) times daily as needed.    Dispense:  90 tablet    Refill:  1  . traMADol (ULTRAM) 50 MG tablet    Sig: TAKE 2 BY MOUTH FOUR TIMES DAILY    Dispense:  720 tablet    Refill:  1    Bari Edward, MD [facility]  05/25/2016

## 2016-05-26 LAB — CBC WITH DIFFERENTIAL/PLATELET
BASOS ABS: 0 10*3/uL (ref 0.0–0.2)
Basos: 1 %
EOS (ABSOLUTE): 0.1 10*3/uL (ref 0.0–0.4)
Eos: 3 %
Hematocrit: 36.7 % (ref 34.0–46.6)
Hemoglobin: 12.5 g/dL (ref 11.1–15.9)
IMMATURE GRANS (ABS): 0 10*3/uL (ref 0.0–0.1)
Immature Granulocytes: 0 %
LYMPHS: 35 %
Lymphocytes Absolute: 1.7 10*3/uL (ref 0.7–3.1)
MCH: 31.3 pg (ref 26.6–33.0)
MCHC: 34.1 g/dL (ref 31.5–35.7)
MCV: 92 fL (ref 79–97)
Monocytes Absolute: 0.7 10*3/uL (ref 0.1–0.9)
Monocytes: 13 %
NEUTROS ABS: 2.3 10*3/uL (ref 1.4–7.0)
NEUTROS PCT: 48 %
PLATELETS: 252 10*3/uL (ref 150–379)
RBC: 4 x10E6/uL (ref 3.77–5.28)
RDW: 14.1 % (ref 12.3–15.4)
WBC: 4.8 10*3/uL (ref 3.4–10.8)

## 2016-05-26 LAB — COMPREHENSIVE METABOLIC PANEL
ALT: 50 IU/L — AB (ref 0–32)
AST: 20 IU/L (ref 0–40)
Albumin/Globulin Ratio: 1.7 (ref 1.2–2.2)
Albumin: 4 g/dL (ref 3.5–4.7)
Alkaline Phosphatase: 102 IU/L (ref 39–117)
BILIRUBIN TOTAL: 0.3 mg/dL (ref 0.0–1.2)
BUN/Creatinine Ratio: 23 (ref 12–28)
BUN: 18 mg/dL (ref 8–27)
CHLORIDE: 96 mmol/L (ref 96–106)
CO2: 27 mmol/L (ref 18–29)
Calcium: 9.4 mg/dL (ref 8.7–10.3)
Creatinine, Ser: 0.77 mg/dL (ref 0.57–1.00)
GFR calc non Af Amer: 69 mL/min/{1.73_m2} (ref >59)
GFR, EST AFRICAN AMERICAN: 79 mL/min/{1.73_m2} (ref >59)
GLUCOSE: 92 mg/dL (ref 65–99)
Globulin, Total: 2.3 g/dL (ref 1.5–4.5)
Potassium: 4 mmol/L (ref 3.5–5.2)
Sodium: 139 mmol/L (ref 134–144)
TOTAL PROTEIN: 6.3 g/dL (ref 6.0–8.5)

## 2016-05-26 LAB — TSH: TSH: 1.58 u[IU]/mL (ref 0.450–4.500)

## 2016-07-02 ENCOUNTER — Ambulatory Visit: Payer: Medicare Other | Admitting: Internal Medicine

## 2016-10-04 ENCOUNTER — Other Ambulatory Visit: Payer: Self-pay | Admitting: Internal Medicine

## 2016-10-04 DIAGNOSIS — F411 Generalized anxiety disorder: Secondary | ICD-10-CM

## 2016-10-04 MED ORDER — LORAZEPAM 0.5 MG PO TABS: 0.5000 mg | ORAL_TABLET | Freq: Three times a day (TID) | ORAL | 2 refills | Status: DC | PRN

## 2016-11-03 ENCOUNTER — Ambulatory Visit: Payer: Medicare Other | Admitting: Internal Medicine

## 2016-11-03 ENCOUNTER — Ambulatory Visit (INDEPENDENT_AMBULATORY_CARE_PROVIDER_SITE_OTHER): Payer: Medicare Other | Admitting: Internal Medicine

## 2016-11-03 ENCOUNTER — Encounter: Payer: Self-pay | Admitting: Internal Medicine

## 2016-11-03 VITALS — BP 122/78 | HR 57

## 2016-11-03 DIAGNOSIS — I10 Essential (primary) hypertension: Secondary | ICD-10-CM

## 2016-11-03 DIAGNOSIS — F411 Generalized anxiety disorder: Secondary | ICD-10-CM | POA: Diagnosis not present

## 2016-11-03 DIAGNOSIS — M159 Polyosteoarthritis, unspecified: Secondary | ICD-10-CM | POA: Diagnosis not present

## 2016-11-03 DIAGNOSIS — Z23 Encounter for immunization: Secondary | ICD-10-CM | POA: Diagnosis not present

## 2016-11-03 DIAGNOSIS — M549 Dorsalgia, unspecified: Secondary | ICD-10-CM

## 2016-11-03 DIAGNOSIS — G8929 Other chronic pain: Secondary | ICD-10-CM | POA: Diagnosis not present

## 2016-11-03 MED ORDER — TRAMADOL HCL 50 MG PO TABS: ORAL_TABLET | ORAL | 1 refills | Status: DC

## 2016-11-03 NOTE — Progress Notes (Addendum)
Date:  11/03/2016   Name:  Joan Singleton   DOB:  [birth year]   MRN:  [id number]   Chief Complaint: Hypertension  Joan Singleton is here today to have an FLT form completed for respite care. Her family is going to a wedding in [place]  starting October 5. Joan Singleton will be going to [facility] for 2 weeks. Call she's entering a care facility, I strongly recommend that she receive the flu vaccine today she and her daughter are agreeable.   Hypertension  This is a chronic problem. The problem is controlled. Associated symptoms include anxiety. Pertinent negatives include no chest pain or shortness of breath.  Arthritis  Presents for follow-up visit. She complains of pain. The symptoms have been stable. Affected location: back, knees. Pertinent negatives include no fatigue or fever.  Anxiety  Presents for follow-up visit. Patient reports no chest pain or shortness of breath. Symptoms occur most days. The severity of symptoms is moderate.   Compliance with medications is 76-100%.   Review of Systems  Constitutional: Negative for appetite change (but has probably lost some weight), chills, diaphoresis, fatigue, fever and unexpected weight change.  Respiratory: Negative for chest tightness and shortness of breath.   Cardiovascular: Negative for chest pain and leg swelling.  Gastrointestinal: Negative for abdominal pain and constipation.  Musculoskeletal: Positive for arthralgias, arthritis, back pain and gait problem (uses walker at home).    Patient Active Problem List   Diagnosis Date Noted  . Restless leg syndrome 05/25/2016  . Breath shortness 01/27/2015  . Arthritis urica 08/23/2014  . Back pain, chronic 08/23/2014  . Chronic constipation 08/23/2014  . Essential (primary) hypertension 08/23/2014  . Gastroesophageal reflux disease 08/23/2014  . Anxiety, generalized 08/23/2014  . Generalized OA 08/23/2014  . OP (osteoporosis) 08/23/2014  . Urge incontinence 08/23/2014  .  Tobacco use disorder, mild, in sustained remission 08/23/2014  . Arthritis of knee 02/25/2012    Prior to Admission medications   Medication Sig Start Date End Date Taking? Authorizing Provider  amLODipine (NORVASC) 5 MG tablet TAKE 1 BY MOUTH DAILY 05/25/16  Yes Reubin Milan, MD  citalopram (CELEXA) 20 MG tablet TAKE 1 BY MOUTH DAILY 05/25/16  Yes Reubin Milan, MD  colchicine (COLCRYS) 0.6 MG tablet Reported on 02/10/2015 02/18/12  Yes [provider]  lisinopril-hydrochlorothiazide (PRINZIDE,ZESTORETIC) 20-25 MG tablet TAKE 1 BY MOUTH DAILY 05/25/16  Yes Reubin Milan, MD  LORazepam (ATIVAN) 0.5 MG tablet Take 1 tablet (0.5 mg total) by mouth 3 (three) times daily as needed. 10/04/16  Yes Reubin Milan, MD  meclizine (ANTIVERT) 25 MG tablet TAKE 1 TABLET(25 MG) BY MOUTH THREE TIMES DAILY AS NEEDED FOR DIZZINESS 02/27/15  Yes Reubin Milan, MD  metoprolol succinate (TOPROL-XL) 50 MG 24 hr tablet Take 1 tablet (50 mg total) by mouth daily. Take with or immediately following a meal. 05/25/16  Yes Reubin Milan, MD  omeprazole (PRILOSEC) 20 MG capsule TAKE 1 BY MOUTH DAILY 05/25/16  Yes Reubin Milan, MD  oxybutynin (DITROPAN) 5 MG tablet TAKE 1 BY MOUTH 3 TIMES DAILY 05/25/16  Yes Reubin Milan, MD  rOPINIRole (REQUIP) 1 MG tablet Take 2 tablets (2 mg total) by mouth at bedtime. 05/25/16  Yes Reubin Milan, MD  traMADol Janean Sark) 50 MG tablet TAKE 2 BY MOUTH FOUR TIMES DAILY 05/25/16  Yes Reubin Milan, MD    No Known Allergies  Past Surgical History:  Procedure Laterality Date  .  COLONOSCOPY  2000   normal  . EXPLORATORY LAPAROTOMY  2008   adhesions    Social History  Substance Use Topics  . Smoking status: Former Games developermoker  . Smokeless tobacco: Never Used  . Alcohol use 1.2 oz/week    2 Standard drinks or equivalent per week     Medication list has been reviewed and updated.  PHQ 2/9 Scores 11/03/2016 10/03/2015 08/27/2014  PHQ - 2 Score 0 0 1     Physical Exam  Constitutional: She is oriented to person, place, and time. She appears well-developed. No distress.  HENT:  Head: Normocephalic and atraumatic.  Right Ear: Decreased hearing is noted.  Left Ear: Decreased hearing is noted.  Neck: Normal range of motion.  Cardiovascular: Normal rate, regular rhythm and normal heart sounds.   Pulmonary/Chest: Effort normal and breath sounds normal. No respiratory distress. She has no wheezes.  Abdominal: Soft. Bowel sounds are normal. There is no tenderness.  Musculoskeletal: She exhibits no edema or tenderness.       Right knee: She exhibits decreased range of motion.       Left knee: She exhibits decreased range of motion.       Lumbar back: She exhibits no tenderness.  Neurological: She is alert and oriented to person, place, and time.  Skin: Skin is warm and dry. No rash noted.  Psychiatric: She has a normal mood and affect. Her speech is normal and behavior is normal. Thought content normal.  Nursing note and vitals reviewed.   BP 122/78   Pulse (!) 57   SpO2 95%   Assessment and Plan: 1. Essential (primary) hypertension Controlled, continue current medication  2. Generalized OA Continue Tramadol + Tylenol Family aware of the risks of high dose medication use and prefer comfort  3. Anxiety, generalized Stable; continue lorazepam  4. Other chronic back pain - traMADol (ULTRAM) 50 MG tablet; TAKE 2 BY MOUTH FOUR TIMES DAILY  Dispense: 720 tablet; Refill: 1  5. Need for influenza vaccination - Flu vaccine HIGH DOSE PF  FL[facility]  Meds ordered this encounter  Medications  . traMADol (ULTRAM) 50 MG tablet    Sig: TAKE 2 BY MOUTH FOUR TIMES DAILY    Dispense:  720 tablet    Refill:  1    Partially dictated using Animal nutritionistDragon software. Any errors are unintentional.  Bari EdwardLaura Nasra Counce, MD [facility]  11/03/2016

## 2016-11-10 ENCOUNTER — Other Ambulatory Visit: Payer: Self-pay | Admitting: Internal Medicine

## 2016-11-10 ENCOUNTER — Telehealth: Payer: Self-pay

## 2016-11-10 DIAGNOSIS — Z111 Encounter for screening for respiratory tuberculosis: Secondary | ICD-10-CM

## 2016-11-10 NOTE — Telephone Encounter (Signed)
Patient daughter- Darl Pikes called requesting to get Tb Blood test ordered for TB. Please Advise.

## 2016-11-10 NOTE — Telephone Encounter (Signed)
Orders placed and ready to be released.

## 2016-11-11 ENCOUNTER — Other Ambulatory Visit: Payer: Self-pay

## 2016-11-11 DIAGNOSIS — Z111 Encounter for screening for respiratory tuberculosis: Secondary | ICD-10-CM

## 2016-11-11 NOTE — Telephone Encounter (Signed)
Pt daughter informed to pick up labs. Up front for pickup

## 2016-11-15 DIAGNOSIS — Z111 Encounter for screening for respiratory tuberculosis: Secondary | ICD-10-CM | POA: Diagnosis not present

## 2016-11-20 LAB — QUANTIFERON-TB GOLD PLUS (RQFGPL)
QUANTIFERON TB1 AG VALUE: 0.03 [IU]/mL
QuantiFERON Nil Value: 0.03 IU/mL
QuantiFERON TB2 Ag Value: 0.03 IU/mL

## 2016-11-20 LAB — QUANTIFERON-TB GOLD PLUS: QuantiFERON-TB Gold Plus: NEGATIVE

## 2016-11-23 ENCOUNTER — Telehealth: Payer: Self-pay

## 2016-11-23 ENCOUNTER — Encounter: Payer: Self-pay | Admitting: Internal Medicine

## 2016-11-23 NOTE — Telephone Encounter (Signed)
Letter written

## 2016-11-23 NOTE — Telephone Encounter (Signed)
Patient daughter Ruthine Dose called stating she needs TB Gold test result faxed to Nursing Home in [place].  Fax# [phone]  Also, states at nursing home they will not allow Mrs. Hosang to have a handle bar on the toilet without a doctors note. Wants to know if we can FAX doctor's note to the same number above stating patient needs handle bar. She stated patient had one at home.   Please Advise.

## 2016-11-23 NOTE — Telephone Encounter (Signed)
Pt informed

## 2016-12-17 ENCOUNTER — Other Ambulatory Visit: Payer: Self-pay | Admitting: Internal Medicine

## 2016-12-17 DIAGNOSIS — F411 Generalized anxiety disorder: Secondary | ICD-10-CM

## 2016-12-17 MED ORDER — ROPINIROLE HCL 1 MG PO TABS: 2.0000 mg | ORAL_TABLET | Freq: Every day | ORAL | 1 refills | Status: DC

## 2016-12-17 MED ORDER — LORAZEPAM 0.5 MG PO TABS: 0.5000 mg | ORAL_TABLET | Freq: Three times a day (TID) | ORAL | 5 refills | Status: AC | PRN

## 2017-04-07 ENCOUNTER — Other Ambulatory Visit: Payer: Self-pay | Admitting: Internal Medicine

## 2017-04-22 ENCOUNTER — Ambulatory Visit: Payer: Medicare Other | Admitting: Internal Medicine

## 2017-04-22 ENCOUNTER — Encounter: Payer: Self-pay | Admitting: Internal Medicine

## 2017-04-22 VITALS — BP 97/76 | HR 78 | Resp 16 | Ht 60.0 in | Wt 104.0 lb

## 2017-04-22 DIAGNOSIS — M549 Dorsalgia, unspecified: Secondary | ICD-10-CM

## 2017-04-22 DIAGNOSIS — R634 Abnormal weight loss: Secondary | ICD-10-CM | POA: Diagnosis not present

## 2017-04-22 DIAGNOSIS — M545 Low back pain: Secondary | ICD-10-CM

## 2017-04-22 DIAGNOSIS — I10 Essential (primary) hypertension: Secondary | ICD-10-CM

## 2017-04-22 DIAGNOSIS — M159 Polyosteoarthritis, unspecified: Secondary | ICD-10-CM

## 2017-04-22 DIAGNOSIS — K219 Gastro-esophageal reflux disease without esophagitis: Secondary | ICD-10-CM | POA: Diagnosis not present

## 2017-04-22 DIAGNOSIS — G8929 Other chronic pain: Secondary | ICD-10-CM | POA: Diagnosis not present

## 2017-04-22 MED ORDER — AMLODIPINE BESYLATE 5 MG PO TABS: ORAL_TABLET | ORAL | 3 refills | Status: DC

## 2017-04-22 MED ORDER — CITALOPRAM HYDROBROMIDE 20 MG PO TABS: ORAL_TABLET | ORAL | 3 refills | Status: AC

## 2017-04-22 MED ORDER — TRAMADOL HCL 50 MG PO TABS: ORAL_TABLET | ORAL | 1 refills | Status: DC

## 2017-04-22 MED ORDER — METOPROLOL SUCCINATE ER 50 MG PO TB24: 50.0000 mg | ORAL_TABLET | Freq: Every day | ORAL | 3 refills | Status: AC

## 2017-04-22 MED ORDER — LISINOPRIL-HYDROCHLOROTHIAZIDE 20-25 MG PO TABS: ORAL_TABLET | ORAL | 3 refills | Status: AC

## 2017-04-22 MED ORDER — OXYBUTYNIN CHLORIDE 5 MG PO TABS: ORAL_TABLET | ORAL | 3 refills | Status: AC

## 2017-04-22 MED ORDER — OMEPRAZOLE 20 MG PO CPDR: DELAYED_RELEASE_CAPSULE | ORAL | 3 refills | Status: AC

## 2017-04-22 MED ORDER — ROPINIROLE HCL 1 MG PO TABS: 2.0000 mg | ORAL_TABLET | Freq: Every day | ORAL | 1 refills | Status: DC

## 2017-04-22 NOTE — Progress Notes (Signed)
Date:  04/22/2017   Name:  Joan Singleton   DOB:  1924/10/03   MRN:  [id number]   Chief Complaint: Hypertension and Vaginitis (Using lots of cream on vaginal area for few months. No dysuria or frequent urination...) Hypertension  This is a chronic problem. The problem is controlled. Associated symptoms include anxiety and headaches. Pertinent negatives include no chest pain, palpitations or shortness of breath. Past treatments include beta blockers, calcium channel blockers, ACE inhibitors and diuretics. The current treatment provides significant improvement.  Back Pain  This is a chronic problem. The problem is unchanged. The pain is present in the lumbar spine. The pain radiates to the left knee and right knee. The pain is worse during the day. The symptoms are aggravated by standing. Associated symptoms include headaches and weakness. Pertinent negatives include no abdominal pain, chest pain, dysuria or fever. She has tried analgesics for the symptoms.  Gastroesophageal Reflux  She complains of heartburn. She reports no abdominal pain, no chest pain or no coughing. This is a chronic problem. The problem occurs occasionally. Pertinent negatives include no fatigue. She has tried a PPI for the symptoms.  Anxiety  Presents for follow-up visit. Patient reports no chest pain, dizziness, palpitations or shortness of breath. Symptoms occur most days.    Rash  This is a chronic problem. The problem has been resolved since onset. The affected locations include the genitalia (she is using desitin daily - also uses depends and has incontinence). Pertinent negatives include no cough, diarrhea, fatigue, fever or shortness of breath. The treatment provided significant relief.  Weight loss - last accurate weight was one year ago.  Today she had to hold the bar so weight is probably slightly more.  Daughter knows that she is losing weight because her appetite is decreased.    Review of Systems    Constitutional: Negative for chills, fatigue and fever.  HENT: Positive for dental problem. Negative for trouble swallowing.   Respiratory: Negative for cough, chest tightness and shortness of breath.   Cardiovascular: Negative for chest pain, palpitations and leg swelling.  Gastrointestinal: Positive for heartburn. Negative for abdominal pain, constipation and diarrhea.  Genitourinary: Negative for dysuria, vaginal bleeding and vaginal pain.  Musculoskeletal: Positive for back pain.  Skin: Negative for color change and rash.  Allergic/Immunologic: Negative for environmental allergies.  Neurological: Positive for weakness and headaches. Negative for dizziness and light-headedness.  Psychiatric/Behavioral: Negative for sleep disturbance.    Patient Active Problem List   Diagnosis Date Noted  . Restless leg syndrome 05/25/2016  . Arthritis urica 08/23/2014  . Back pain, chronic 08/23/2014  . Chronic constipation 08/23/2014  . Essential (primary) hypertension 08/23/2014  . Gastroesophageal reflux disease 08/23/2014  . Anxiety, generalized 08/23/2014  . Generalized OA 08/23/2014  . OP (osteoporosis) 08/23/2014  . Urge incontinence 08/23/2014  . Tobacco use disorder, mild, in sustained remission 08/23/2014  . Arthritis of knee 02/25/2012    Prior to Admission medications   Medication Sig Start Date End Date Taking? Authorizing Provider  amLODipine (NORVASC) 5 MG tablet TAKE 1 BY MOUTH DAILY 05/25/16   Reubin Milan, MD  citalopram (CELEXA) 20 MG tablet TAKE 1 BY MOUTH DAILY 05/25/16   Reubin Milan, MD  colchicine (COLCRYS) 0.6 MG tablet Reported on 02/10/2015 02/18/12   [provider]  lisinopril-hydrochlorothiazide (PRINZIDE,ZESTORETIC) 20-25 MG tablet TAKE 1 BY MOUTH DAILY 05/25/16   Reubin Milan, MD  LORazepam (ATIVAN) 0.5 MG tablet TAKE 1 TABLET BY MOUTH  THREE TIMES DAILY AS NEEDED 04/07/17   Reubin Milan, MD  meclizine (ANTIVERT) 25 MG tablet TAKE 1  TABLET(25 MG) BY MOUTH THREE TIMES DAILY AS NEEDED FOR DIZZINESS 02/27/15   Reubin Milan, MD  metoprolol succinate (TOPROL-XL) 50 MG 24 hr tablet Take 1 tablet (50 mg total) by mouth daily. Take with or immediately following a meal. 05/25/16   Reubin Milan, MD  omeprazole (PRILOSEC) 20 MG capsule TAKE 1 BY MOUTH DAILY 05/25/16   Reubin Milan, MD  oxybutynin (DITROPAN) 5 MG tablet TAKE 1 BY MOUTH 3 TIMES DAILY 05/25/16   Reubin Milan, MD  rOPINIRole (REQUIP) 1 MG tablet Take 2 tablets (2 mg total) by mouth at bedtime. 12/17/16   Reubin Milan, MD  traMADol Janean Sark) 50 MG tablet TAKE 2 BY MOUTH FOUR TIMES DAILY 11/03/16   Reubin Milan, MD    No Known Allergies  Past Surgical History:  Procedure Laterality Date  . COLONOSCOPY  2000   normal  . EXPLORATORY LAPAROTOMY  2008   adhesions    Social History   Tobacco Use  . Smoking status: Former Games developer  . Smokeless tobacco: Never Used  Substance Use Topics  . Alcohol use: Yes    Alcohol/week: 1.2 oz    Types: 2 Standard drinks or equivalent per week  . Drug use: No     Medication list has been reviewed and updated.  PHQ 2/9 Scores 11/03/2016 10/03/2015 08/27/2014  PHQ - 2 Score 0 0 1    Physical Exam  Constitutional: She is oriented to person, place, and time. She appears well-developed. No distress.  Very alert, pleasant and interactive  HENT:  Head: Normocephalic and atraumatic.  Right Ear: Decreased hearing is noted.  Left Ear: Decreased hearing is noted.  Neck: Normal range of motion. Neck supple. No thyromegaly present.  Cardiovascular: Normal rate, regular rhythm and normal heart sounds.  Pulmonary/Chest: Effort normal and breath sounds normal. No respiratory distress. She has no wheezes.  Abdominal: Soft. Bowel sounds are normal. There is no tenderness.  Musculoskeletal: She exhibits no edema or tenderness.       Right wrist: She exhibits bony tenderness and deformity.       Left wrist: She exhibits  bony tenderness and deformity.       Right knee: She exhibits decreased range of motion.       Left knee: She exhibits decreased range of motion.       Lumbar back: She exhibits bony tenderness.  Neurological: She is alert and oriented to person, place, and time.  Skin: Skin is warm and dry. No rash noted.  Psychiatric: She has a normal mood and affect. Her speech is normal and behavior is normal. Thought content normal.  Nursing note and vitals reviewed.   BP 97/76   Pulse 78   Resp 16   Ht 5' (1.524 m)   Wt 104 lb (47.2 kg) Comment: held bar during weight  SpO2 97%   BMI 20.31 kg/m   Assessment and Plan: 1. Essential (primary) hypertension Controlled, may be able to reduce medications - amLODipine (NORVASC) 5 MG tablet; TAKE 1 BY MOUTH DAILY  Dispense: 90 tablet; Refill: 3 - lisinopril-hydrochlorothiazide (PRINZIDE,ZESTORETIC) 20-25 MG tablet; TAKE 1 BY MOUTH DAILY  Dispense: 90 tablet; Refill: 3 - metoprolol succinate (TOPROL-XL) 50 MG 24 hr tablet; Take 1 tablet (50 mg total) by mouth daily. Take with or immediately following a meal.  Dispense: 90 tablet; Refill: 3 -  CBC with Differential/Platelet - Comprehensive metabolic panel - TSH  2. Generalized OA Continue Tramadol  3. Chronic midline low back pain without sciatica Continue Tramadol  4. Gastroesophageal reflux disease, esophagitis presence not specified stable - omeprazole (PRILOSEC) 20 MG capsule; TAKE 1 BY MOUTH DAILY  Dispense: 90 capsule; Refill: 3  5. Weight loss, unintentional Check labs Encourage use of supplements like Ensure or Boost daily   Meds ordered this encounter  Medications  . amLODipine (NORVASC) 5 MG tablet    Sig: TAKE 1 BY MOUTH DAILY    Dispense:  90 tablet    Refill:  3  . citalopram (CELEXA) 20 MG tablet    Sig: TAKE 1 BY MOUTH DAILY    Dispense:  90 tablet    Refill:  3  . lisinopril-hydrochlorothiazide (PRINZIDE,ZESTORETIC) 20-25 MG tablet    Sig: TAKE 1 BY MOUTH DAILY     Dispense:  90 tablet    Refill:  3  . omeprazole (PRILOSEC) 20 MG capsule    Sig: TAKE 1 BY MOUTH DAILY    Dispense:  90 capsule    Refill:  3  . oxybutynin (DITROPAN) 5 MG tablet    Sig: TAKE 1 BY MOUTH 3 TIMES DAILY    Dispense:  270 tablet    Refill:  3  . rOPINIRole (REQUIP) 1 MG tablet    Sig: Take 2 tablets (2 mg total) by mouth at bedtime.    Dispense:  180 tablet    Refill:  1  . metoprolol succinate (TOPROL-XL) 50 MG 24 hr tablet    Sig: Take 1 tablet (50 mg total) by mouth daily. Take with or immediately following a meal.    Dispense:  90 tablet    Refill:  3  . traMADol (ULTRAM) 50 MG tablet    Sig: TAKE 2 BY MOUTH FOUR TIMES DAILY    Dispense:  720 tablet    Refill:  1    Partially dictated using Animal nutritionistDragon software. Any errors are unintentional.  Bari EdwardLaura Ainsley Sanguinetti, MD [facility]  04/22/2017

## 2017-04-23 LAB — COMPREHENSIVE METABOLIC PANEL
A/G RATIO: 1.4 (ref 1.2–2.2)
ALBUMIN: 4 g/dL (ref 3.2–4.6)
ALT: 11 IU/L (ref 0–32)
AST: 13 IU/L (ref 0–40)
Alkaline Phosphatase: 74 IU/L (ref 39–117)
BILIRUBIN TOTAL: 0.3 mg/dL (ref 0.0–1.2)
BUN / CREAT RATIO: 21 (ref 12–28)
BUN: 15 mg/dL (ref 10–36)
CHLORIDE: 97 mmol/L (ref 96–106)
CO2: 26 mmol/L (ref 20–29)
Calcium: 9.8 mg/dL (ref 8.7–10.3)
Creatinine, Ser: 0.71 mg/dL (ref 0.57–1.00)
GFR calc non Af Amer: 75 mL/min/{1.73_m2} (ref >59)
GFR, EST AFRICAN AMERICAN: 87 mL/min/{1.73_m2} (ref >59)
GLOBULIN, TOTAL: 2.8 g/dL (ref 1.5–4.5)
Glucose: 91 mg/dL (ref 65–99)
POTASSIUM: 4.7 mmol/L (ref 3.5–5.2)
SODIUM: 137 mmol/L (ref 134–144)
TOTAL PROTEIN: 6.8 g/dL (ref 6.0–8.5)

## 2017-04-23 LAB — CBC WITH DIFFERENTIAL/PLATELET
BASOS ABS: 0 10*3/uL (ref 0.0–0.2)
Basos: 1 %
EOS (ABSOLUTE): 0.2 10*3/uL (ref 0.0–0.4)
Eos: 4 %
HEMOGLOBIN: 12.7 g/dL (ref 11.1–15.9)
Hematocrit: 36.5 % (ref 34.0–46.6)
IMMATURE GRANS (ABS): 0 10*3/uL (ref 0.0–0.1)
Immature Granulocytes: 0 %
LYMPHS ABS: 1.9 10*3/uL (ref 0.7–3.1)
LYMPHS: 36 %
MCH: 31.3 pg (ref 26.6–33.0)
MCHC: 34.8 g/dL (ref 31.5–35.7)
MCV: 90 fL (ref 79–97)
MONOCYTES: 13 %
Monocytes Absolute: 0.7 10*3/uL (ref 0.1–0.9)
NEUTROS ABS: 2.5 10*3/uL (ref 1.4–7.0)
Neutrophils: 46 %
Platelets: 245 10*3/uL (ref 150–379)
RBC: 4.06 x10E6/uL (ref 3.77–5.28)
RDW: 13.8 % (ref 12.3–15.4)
WBC: 5.2 10*3/uL (ref 3.4–10.8)

## 2017-04-23 LAB — TSH: TSH: 1.49 u[IU]/mL (ref 0.450–4.500)

## 2017-07-23 DIAGNOSIS — I10 Essential (primary) hypertension: Secondary | ICD-10-CM | POA: Diagnosis not present

## 2017-07-23 DIAGNOSIS — M1990 Unspecified osteoarthritis, unspecified site: Secondary | ICD-10-CM | POA: Diagnosis not present

## 2017-07-23 DIAGNOSIS — M1711 Unilateral primary osteoarthritis, right knee: Secondary | ICD-10-CM | POA: Diagnosis not present

## 2017-07-23 DIAGNOSIS — M25561 Pain in right knee: Secondary | ICD-10-CM | POA: Diagnosis not present

## 2017-07-23 DIAGNOSIS — M25461 Effusion, right knee: Secondary | ICD-10-CM | POA: Diagnosis not present

## 2017-07-23 DIAGNOSIS — M11261 Other chondrocalcinosis, right knee: Secondary | ICD-10-CM | POA: Diagnosis not present

## 2017-07-23 DIAGNOSIS — M109 Gout, unspecified: Secondary | ICD-10-CM | POA: Diagnosis not present

## 2017-08-19 DIAGNOSIS — Z66 Do not resuscitate: Secondary | ICD-10-CM | POA: Diagnosis not present

## 2017-08-19 DIAGNOSIS — G8929 Other chronic pain: Secondary | ICD-10-CM | POA: Diagnosis not present

## 2017-08-19 DIAGNOSIS — M1990 Unspecified osteoarthritis, unspecified site: Secondary | ICD-10-CM | POA: Diagnosis not present

## 2017-08-19 DIAGNOSIS — Z515 Encounter for palliative care: Secondary | ICD-10-CM | POA: Diagnosis not present

## 2017-09-02 ENCOUNTER — Telehealth: Payer: Self-pay

## 2017-09-02 NOTE — Telephone Encounter (Signed)
Home health evaluation for palliative care for PT making us aware. Patient was admitted yesterday. Pt would like see her one time a week and then two times for six weeks for PT LLE strengthening, gait training.. OT evaluation as well. Orders will be faxed over as well

## 2017-09-09 ENCOUNTER — Other Ambulatory Visit (INDEPENDENT_AMBULATORY_CARE_PROVIDER_SITE_OTHER): Payer: Medicare Other | Admitting: Internal Medicine

## 2017-09-09 DIAGNOSIS — M109 Gout, unspecified: Secondary | ICD-10-CM

## 2017-09-09 DIAGNOSIS — M159 Polyosteoarthritis, unspecified: Secondary | ICD-10-CM

## 2017-09-09 DIAGNOSIS — R262 Difficulty in walking, not elsewhere classified: Secondary | ICD-10-CM | POA: Insufficient documentation

## 2017-09-09 DIAGNOSIS — I10 Essential (primary) hypertension: Secondary | ICD-10-CM

## 2017-09-09 NOTE — Progress Notes (Signed)
Received orders from [facility].  Start of care 09/01/17 through 10/30/17.  Initial certification. Orders are reviewed, signed and faxed.

## 2017-09-14 ENCOUNTER — Ambulatory Visit: Payer: Medicare Other | Admitting: Internal Medicine

## 2017-09-16 ENCOUNTER — Encounter: Payer: Self-pay | Admitting: Internal Medicine

## 2017-09-16 ENCOUNTER — Ambulatory Visit: Payer: Medicare Other | Admitting: Internal Medicine

## 2017-09-16 VITALS — BP 84/60 | HR 64 | Ht 60.0 in

## 2017-09-16 DIAGNOSIS — K5909 Other constipation: Secondary | ICD-10-CM

## 2017-09-16 DIAGNOSIS — F411 Generalized anxiety disorder: Secondary | ICD-10-CM | POA: Diagnosis not present

## 2017-09-16 DIAGNOSIS — I10 Essential (primary) hypertension: Secondary | ICD-10-CM

## 2017-09-16 MED ORDER — LORAZEPAM 0.5 MG PO TABS: 0.5000 mg | ORAL_TABLET | Freq: Three times a day (TID) | ORAL | 1 refills | Status: DC | PRN

## 2017-09-16 NOTE — Patient Instructions (Signed)
Stop Amlodipine  Can add Colace 100 mg once a day for constipation

## 2017-09-16 NOTE — Progress Notes (Signed)
Date:  09/16/2017   Name:  Joan LamMarilyn A Singleton   DOB:  March 01, 1924   MRN:  [id number]   Chief Complaint: Edema (Swollen ankles. On and off. Not swollen currently. ) and Anxiety Anxiety  Presents for follow-up visit. Symptoms include confusion and nervous/anxious behavior. Patient reports no chest pain, dizziness, nausea, palpitations, shortness of breath or suicidal ideas. Symptoms occur most days. The quality of sleep is good.    Hypertension  This is a chronic problem. The problem is unchanged. The problem is controlled. Associated symptoms include anxiety and peripheral edema. Pertinent negatives include no chest pain, headaches, palpitations or shortness of breath. Past treatments include calcium channel blockers, ACE inhibitors and diuretics. There are no compliance problems.   Constipation  This is a chronic problem. Progression since onset: but much worse last week. The stool is described as firm. The patient is not on a high fiber diet. She does not exercise regularly. There has not been adequate water intake. Associated symptoms include abdominal pain and back pain. Pertinent negatives include no anorexia, diarrhea, fever, hematochezia, nausea or vomiting. Risk factors include immobility. Treatments tried: daughter recently started her on senna tea. The treatment provided moderate relief.   Edema - intermittent on either ankle.  Patient is not bothered by this.  It comes and goes without intervention.  She is taking amlodipine and BP is running on the low side of normal.   Review of Systems  Constitutional: Negative for chills, fatigue and fever.  Respiratory: Negative for cough, chest tightness, shortness of breath and wheezing.   Cardiovascular: Positive for leg swelling. Negative for chest pain and palpitations.  Gastrointestinal: Positive for abdominal pain and constipation. Negative for anorexia, blood in stool, diarrhea, hematochezia, nausea and vomiting.  Genitourinary: Negative  for dysuria.  Musculoskeletal: Positive for arthralgias and back pain.  Skin: Negative for rash and wound.  Neurological: Negative for dizziness and headaches.  Psychiatric/Behavioral: Positive for confusion. Negative for suicidal ideas. The patient is nervous/anxious.     Patient Active Problem List   Diagnosis Date Noted  . Difficulty walking 09/09/2017  . Weight loss, unintentional 04/22/2017  . Restless leg syndrome 05/25/2016  . Arthritis urica 08/23/2014  . Back pain, chronic 08/23/2014  . Chronic constipation 08/23/2014  . Essential (primary) hypertension 08/23/2014  . Gastroesophageal reflux disease 08/23/2014  . Anxiety, generalized 08/23/2014  . Generalized OA 08/23/2014  . OP (osteoporosis) 08/23/2014  . Urge incontinence 08/23/2014  . Tobacco use disorder, mild, in sustained remission 08/23/2014  . Arthritis of knee 02/25/2012    Prior to Admission medications   Medication Sig Start Date End Date Taking? Authorizing Provider  amLODipine (NORVASC) 5 MG tablet TAKE 1 BY MOUTH DAILY 04/22/17  Yes Reubin MilanBerglund, Geremiah Fussell H, MD  citalopram (CELEXA) 20 MG tablet TAKE 1 BY MOUTH DAILY 04/22/17  Yes Reubin MilanBerglund, Sima Lindenberger H, MD  colchicine (COLCRYS) 0.6 MG tablet Reported on 02/10/2015 02/18/12  Yes [provider]  lisinopril-hydrochlorothiazide (PRINZIDE,ZESTORETIC) 20-25 MG tablet TAKE 1 BY MOUTH DAILY 04/22/17  Yes Reubin MilanBerglund, Llewyn Heap H, MD  LORazepam (ATIVAN) 0.5 MG tablet TAKE 1 TABLET BY MOUTH THREE TIMES DAILY AS NEEDED 04/07/17  Yes Reubin MilanBerglund, Cyndra Feinberg H, MD  meclizine (ANTIVERT) 25 MG tablet TAKE 1 TABLET(25 MG) BY MOUTH THREE TIMES DAILY AS NEEDED FOR DIZZINESS 02/27/15  Yes Reubin MilanBerglund, Jery Hollern H, MD  metoprolol succinate (TOPROL-XL) 50 MG 24 hr tablet Take 1 tablet (50 mg total) by mouth daily. Take with or immediately following a meal. 04/22/17  Yes  Reubin Milan, MD  omeprazole (PRILOSEC) 20 MG capsule TAKE 1 BY MOUTH DAILY 04/22/17  Yes Reubin Milan, MD  oxybutynin (DITROPAN) 5 MG  tablet TAKE 1 BY MOUTH 3 TIMES DAILY 04/22/17  Yes Reubin Milan, MD  rOPINIRole (REQUIP) 1 MG tablet Take 2 tablets (2 mg total) by mouth at bedtime. 04/22/17  Yes Reubin Milan, MD  traMADol Janean Sark) 50 MG tablet TAKE 2 BY MOUTH FOUR TIMES DAILY 04/22/17  Yes Reubin Milan, MD    No Known Allergies  Past Surgical History:  Procedure Laterality Date  . COLONOSCOPY  2000   normal  . EXPLORATORY LAPAROTOMY  2008   adhesions    Social History   Tobacco Use  . Smoking status: Former Games developer  . Smokeless tobacco: Never Used  Substance Use Topics  . Alcohol use: Yes    Alcohol/week: 1.2 oz    Types: 2 Standard drinks or equivalent per week  . Drug use: No     Medication list has been reviewed and updated.  Current Meds  Medication Sig  . amLODipine (NORVASC) 5 MG tablet TAKE 1 BY MOUTH DAILY  . citalopram (CELEXA) 20 MG tablet TAKE 1 BY MOUTH DAILY  . colchicine (COLCRYS) 0.6 MG tablet Reported on 02/10/2015  . lisinopril-hydrochlorothiazide (PRINZIDE,ZESTORETIC) 20-25 MG tablet TAKE 1 BY MOUTH DAILY  . LORazepam (ATIVAN) 0.5 MG tablet TAKE 1 TABLET BY MOUTH THREE TIMES DAILY AS NEEDED  . meclizine (ANTIVERT) 25 MG tablet TAKE 1 TABLET(25 MG) BY MOUTH THREE TIMES DAILY AS NEEDED FOR DIZZINESS  . metoprolol succinate (TOPROL-XL) 50 MG 24 hr tablet Take 1 tablet (50 mg total) by mouth daily. Take with or immediately following a meal.  . omeprazole (PRILOSEC) 20 MG capsule TAKE 1 BY MOUTH DAILY  . oxybutynin (DITROPAN) 5 MG tablet TAKE 1 BY MOUTH 3 TIMES DAILY  . rOPINIRole (REQUIP) 1 MG tablet Take 2 tablets (2 mg total) by mouth at bedtime.  . traMADol (ULTRAM) 50 MG tablet TAKE 2 BY MOUTH FOUR TIMES DAILY    PHQ 2/9 Scores 11/03/2016 10/03/2015 08/27/2014  PHQ - 2 Score 0 0 1    Physical Exam  Constitutional: She is oriented to person, place, and time. She appears well-developed and well-nourished. No distress.  HENT:  Head: Normocephalic and atraumatic.  Right Ear:  Decreased hearing is noted.  Left Ear: Decreased hearing is noted.  Cardiovascular: Normal rate, regular rhythm and normal heart sounds. Exam reveals no gallop.  Pulmonary/Chest: Effort normal and breath sounds normal. No respiratory distress. She has no wheezes. She has no rhonchi.  Abdominal: Soft. Normal appearance. There is no tenderness.  Musculoskeletal: She exhibits no edema or tenderness.  Neurological: She is alert and oriented to person, place, and time.  Skin: Skin is warm and dry. No rash noted.  Psychiatric: She has a normal mood and affect. Her behavior is normal. Thought content normal.  Nursing note and vitals reviewed.   BP (!) 84/60   Pulse 64   Ht 5' (1.524 m)   SpO2 96%   BMI 20.31 kg/m   Assessment and Plan: 1. Essential (primary) hypertension Possibly over-treated Will stop amlodipine which may contribute to edema and constipation Continue lisinopril hct  2. Anxiety, generalized Continue celexa - LORazepam (ATIVAN) 0.5 MG tablet; Take 1 tablet (0.5 mg total) by mouth 3 (three) times daily as needed.  Dispense: 90 tablet; Refill: 1  3. Chronic constipation Continue senna tea May need to add colace 100  mg daily as well   Meds ordered this encounter  Medications  . LORazepam (ATIVAN) 0.5 MG tablet    Sig: Take 1 tablet (0.5 mg total) by mouth 3 (three) times daily as needed.    Dispense:  90 tablet    Refill:  1    Partially dictated using Animal nutritionist. Any errors are unintentional.  Bari Edward, MD [facility]  09/16/2017

## 2017-10-10 ENCOUNTER — Other Ambulatory Visit: Payer: Self-pay | Admitting: Internal Medicine

## 2017-10-10 DIAGNOSIS — F411 Generalized anxiety disorder: Secondary | ICD-10-CM

## 2017-10-18 ENCOUNTER — Telehealth: Payer: Self-pay

## 2017-10-18 ENCOUNTER — Other Ambulatory Visit: Payer: Self-pay | Admitting: Internal Medicine

## 2017-10-18 DIAGNOSIS — G8929 Other chronic pain: Secondary | ICD-10-CM

## 2017-10-18 DIAGNOSIS — M549 Dorsalgia, unspecified: Principal | ICD-10-CM

## 2017-10-18 MED ORDER — TRAMADOL HCL 50 MG PO TABS: ORAL_TABLET | ORAL | 0 refills | Status: DC

## 2017-10-18 NOTE — Telephone Encounter (Signed)
Patient daughter called saying [facility] will contact us with more information as to why they cannot fill patient's tramadol at this time. She needs the tramadol to go to a local pharmacy for now since patient is running low on medication. [place] [facility] [place] [facility] [place]. I told her I will be on lookout for information from her Mail order pharm.  Please Advise refill.

## 2017-10-18 NOTE — Telephone Encounter (Signed)
One month sent to [facility].

## 2017-10-18 NOTE — Telephone Encounter (Signed)
Daughter informed

## 2017-10-20 ENCOUNTER — Other Ambulatory Visit: Payer: Self-pay | Admitting: Internal Medicine

## 2017-12-16 ENCOUNTER — Other Ambulatory Visit: Payer: Self-pay | Admitting: Internal Medicine

## 2017-12-16 ENCOUNTER — Telehealth: Payer: Self-pay

## 2017-12-16 DIAGNOSIS — F411 Generalized anxiety disorder: Secondary | ICD-10-CM

## 2017-12-16 MED ORDER — LORAZEPAM 0.5 MG PO TABS: 0.5000 mg | ORAL_TABLET | Freq: Three times a day (TID) | ORAL | 1 refills | Status: DC | PRN

## 2017-12-16 NOTE — Telephone Encounter (Signed)
Patient daughter called requesting med refill for Lorazepam.  Please Advise.  Pharmacy: [facility] in [place].

## 2017-12-16 NOTE — Telephone Encounter (Signed)
Done

## 2018-01-11 ENCOUNTER — Encounter: Payer: Self-pay | Admitting: Internal Medicine

## 2018-01-11 ENCOUNTER — Ambulatory Visit: Payer: Medicare Other | Admitting: Internal Medicine

## 2018-01-11 VITALS — BP 120/64 | HR 64 | Ht 60.0 in

## 2018-01-11 DIAGNOSIS — G8929 Other chronic pain: Secondary | ICD-10-CM | POA: Diagnosis not present

## 2018-01-11 DIAGNOSIS — Z23 Encounter for immunization: Secondary | ICD-10-CM | POA: Diagnosis not present

## 2018-01-11 DIAGNOSIS — M549 Dorsalgia, unspecified: Secondary | ICD-10-CM | POA: Diagnosis not present

## 2018-01-11 DIAGNOSIS — R300 Dysuria: Secondary | ICD-10-CM

## 2018-01-11 MED ORDER — TRAMADOL HCL 50 MG PO TABS: ORAL_TABLET | ORAL | 0 refills | Status: AC

## 2018-01-11 MED ORDER — CEPHALEXIN 250 MG PO CAPS: 250.0000 mg | ORAL_CAPSULE | Freq: Four times a day (QID) | ORAL | 0 refills | Status: AC

## 2018-01-11 NOTE — Progress Notes (Signed)
Date:  01/11/2018   Name:  Joan Singleton   DOB:  1924/05/23   MRN:  [id number]  Patient is here with her daughter Darl PikesSusan. Chief Complaint: Dysuria (Burning when urinating starting this week. Unable to void today at visit. Using external cream on vagina due to paina dn irritation. Need refill on tramadol.  )  Dysuria   This is a new problem. The current episode started yesterday. The quality of the pain is described as burning. The pain is mild. There has been no fever. Pertinent negatives include no chills, frequency, hematuria, nausea or urgency.  Back Pain  This is a chronic problem. The problem occurs daily. The problem is unchanged. The pain is present in the lumbar spine. Associated symptoms include dysuria. Pertinent negatives include no abdominal pain, chest pain or fever. She has tried analgesics for the symptoms. The treatment provided significant relief.  Per her daughter, she complained of dysuria yesterday - a new complaint for her.  Pt applies Aquaphor to her external labia as needed for discomfort.  She uses depends mostly but when she has urinated in the toilet, daughter has not seen any blood, cloudy urine or noticed foul odor. Pt was unable to produce a specimen today.  Review of Systems  Constitutional: Negative for chills and fever.  HENT: Positive for hearing loss.   Respiratory: Negative for chest tightness and shortness of breath.   Cardiovascular: Negative for chest pain, palpitations and leg swelling.  Gastrointestinal: Negative for abdominal pain, constipation, diarrhea and nausea.  Genitourinary: Positive for dysuria. Negative for frequency, hematuria and urgency.  Musculoskeletal: Positive for back pain and gait problem.    Patient Active Problem List   Diagnosis Date Noted  . Difficulty walking 09/09/2017  . Weight loss, unintentional 04/22/2017  . Restless leg syndrome 05/25/2016  . Arthritis urica 08/23/2014  . Back pain, chronic 08/23/2014  . Chronic  constipation 08/23/2014  . Essential (primary) hypertension 08/23/2014  . Gastroesophageal reflux disease 08/23/2014  . Anxiety, generalized 08/23/2014  . Generalized OA 08/23/2014  . OP (osteoporosis) 08/23/2014  . Urge incontinence 08/23/2014  . Tobacco use disorder, mild, in sustained remission 08/23/2014  . Arthritis of knee 02/25/2012    No Known Allergies  Past Surgical History:  Procedure Laterality Date  . COLONOSCOPY  2000   normal  . EXPLORATORY LAPAROTOMY  2008   adhesions    Social History   Tobacco Use  . Smoking status: Former Games developermoker  . Smokeless tobacco: Never Used  Substance Use Topics  . Alcohol use: Yes    Alcohol/week: 2.0 standard drinks    Types: 2 Standard drinks or equivalent per week  . Drug use: No     Medication list has been reviewed and updated.  Current Meds  Medication Sig  . citalopram (CELEXA) 20 MG tablet TAKE 1 BY MOUTH DAILY  . colchicine (COLCRYS) 0.6 MG tablet Reported on 02/10/2015  . lisinopril-hydrochlorothiazide (PRINZIDE,ZESTORETIC) 20-25 MG tablet TAKE 1 BY MOUTH DAILY  . LORazepam (ATIVAN) 0.5 MG tablet Take 1 tablet (0.5 mg total) by mouth 3 (three) times daily as needed.  . meclizine (ANTIVERT) 25 MG tablet TAKE 1 TABLET(25 MG) BY MOUTH THREE TIMES DAILY AS NEEDED FOR DIZZINESS  . metoprolol succinate (TOPROL-XL) 50 MG 24 hr tablet Take 1 tablet (50 mg total) by mouth daily. Take with or immediately following a meal.  . omeprazole (PRILOSEC) 20 MG capsule TAKE 1 BY MOUTH DAILY  . oxybutynin (DITROPAN) 5 MG tablet TAKE 1  BY MOUTH 3 TIMES DAILY  . rOPINIRole (REQUIP) 1 MG tablet TAKE 2 TABLETS BY MOUTH AT BEDTIME  . traMADol (ULTRAM) 50 MG tablet TAKE 2 BY MOUTH FOUR TIMES DAILY    PHQ 2/9 Scores 01/11/2018 11/03/2016 10/03/2015 08/27/2014  PHQ - 2 Score 0 0 0 1    Physical Exam  Constitutional: She appears well-developed. No distress.  HENT:  Head: Normocephalic and atraumatic.  Neck: Normal range of motion.    Cardiovascular: Normal rate, regular rhythm and normal heart sounds.  Pulmonary/Chest: Effort normal and breath sounds normal. No respiratory distress.  Abdominal: Soft. Bowel sounds are normal. There is no tenderness. There is no CVA tenderness.  Musculoskeletal: Normal range of motion.       Lumbar back: She exhibits no tenderness and no spasm.  Lymphadenopathy:    She has no cervical adenopathy.  Neurological: She is alert.  Skin: Skin is warm and dry. No rash noted.  Psychiatric: She has a normal mood and affect. Her behavior is normal. Thought content normal.  Nursing note and vitals reviewed.   BP 120/64 (BP Location: Right Arm, Patient Position: Sitting, Cuff Size: Normal)   Pulse 64   Ht 5' (1.524 m)   BMI 20.31 kg/m   Assessment and Plan: 1. Dysuria Family will try to bring in a specimen Hold keflex unless pt continues to complain of sx Change aquaphor to Zinc oxide cream - cephALEXin (KEFLEX) 250 MG capsule; Take 1 capsule (250 mg total) by mouth 4 (four) times daily for 7 days.  Dispense: 28 capsule; Refill: 0  2. Other chronic back pain Continue tramadol - traMADol (ULTRAM) 50 MG tablet; TAKE 2 BY MOUTH FOUR TIMES DAILY  Dispense: 240 tablet; Refill: 0  3. Encounter for immunization - Flu vaccine HIGH DOSE PF   Partially dictated using Animal nutritionist. Any errors are unintentional.  Bari Edward, MD [facility]  01/11/2018

## 2018-01-11 NOTE — Patient Instructions (Signed)
Change cream to Desitin or other similar diaper rash ointment  Bring in urine specimen if able  Fill Keflex if there are persistent symptom complaints

## 2018-01-13 ENCOUNTER — Ambulatory Visit: Payer: Medicare Other | Admitting: Internal Medicine

## 2018-01-13 ENCOUNTER — Telehealth: Payer: Self-pay

## 2018-01-13 DIAGNOSIS — J111 Influenza due to unidentified influenza virus with other respiratory manifestations: Secondary | ICD-10-CM | POA: Diagnosis not present

## 2018-01-13 DIAGNOSIS — R410 Disorientation, unspecified: Secondary | ICD-10-CM | POA: Diagnosis not present

## 2018-01-13 DIAGNOSIS — R05 Cough: Secondary | ICD-10-CM | POA: Diagnosis not present

## 2018-01-14 DIAGNOSIS — F0390 Unspecified dementia without behavioral disturbance: Secondary | ICD-10-CM | POA: Insufficient documentation

## 2018-01-20 DIAGNOSIS — A499 Bacterial infection, unspecified: Secondary | ICD-10-CM | POA: Diagnosis not present

## 2018-01-20 DIAGNOSIS — M6281 Muscle weakness (generalized): Secondary | ICD-10-CM | POA: Diagnosis not present

## 2018-01-20 DIAGNOSIS — I10 Essential (primary) hypertension: Secondary | ICD-10-CM | POA: Diagnosis not present

## 2018-01-20 DIAGNOSIS — N3281 Overactive bladder: Secondary | ICD-10-CM | POA: Diagnosis not present

## 2018-01-20 DIAGNOSIS — J1000 Influenza due to other identified influenza virus with unspecified type of pneumonia: Secondary | ICD-10-CM | POA: Diagnosis not present

## 2018-01-20 DIAGNOSIS — H9190 Unspecified hearing loss, unspecified ear: Secondary | ICD-10-CM | POA: Diagnosis not present

## 2018-01-20 DIAGNOSIS — M545 Low back pain: Secondary | ICD-10-CM | POA: Diagnosis not present

## 2018-01-20 DIAGNOSIS — H01003 Unspecified blepharitis right eye, unspecified eyelid: Secondary | ICD-10-CM | POA: Diagnosis not present

## 2018-01-20 DIAGNOSIS — F0630 Mood disorder due to known physiological condition, unspecified: Secondary | ICD-10-CM | POA: Diagnosis not present

## 2018-01-20 DIAGNOSIS — G2581 Restless legs syndrome: Secondary | ICD-10-CM | POA: Diagnosis not present

## 2018-01-20 DIAGNOSIS — J09X2 Influenza due to identified novel influenza A virus with other respiratory manifestations: Secondary | ICD-10-CM | POA: Diagnosis not present

## 2018-01-20 DIAGNOSIS — R279 Unspecified lack of coordination: Secondary | ICD-10-CM | POA: Diagnosis not present

## 2018-01-20 DIAGNOSIS — F419 Anxiety disorder, unspecified: Secondary | ICD-10-CM | POA: Diagnosis not present

## 2018-01-20 DIAGNOSIS — R1312 Dysphagia, oropharyngeal phase: Secondary | ICD-10-CM | POA: Diagnosis not present

## 2018-01-20 DIAGNOSIS — F0390 Unspecified dementia without behavioral disturbance: Secondary | ICD-10-CM | POA: Diagnosis not present

## 2018-01-20 DIAGNOSIS — E43 Unspecified severe protein-calorie malnutrition: Secondary | ICD-10-CM | POA: Diagnosis not present

## 2018-01-20 DIAGNOSIS — M109 Gout, unspecified: Secondary | ICD-10-CM | POA: Diagnosis not present

## 2018-01-20 DIAGNOSIS — J439 Emphysema, unspecified: Secondary | ICD-10-CM | POA: Diagnosis not present

## 2018-01-20 DIAGNOSIS — M1380 Other specified arthritis, unspecified site: Secondary | ICD-10-CM | POA: Diagnosis not present

## 2018-01-20 DIAGNOSIS — R1310 Dysphagia, unspecified: Secondary | ICD-10-CM | POA: Diagnosis not present

## 2018-01-20 DIAGNOSIS — R5381 Other malaise: Secondary | ICD-10-CM | POA: Diagnosis not present

## 2018-01-20 MED ORDER — MELATONIN 3 MG PO TABS
3.00 | ORAL_TABLET | ORAL | Status: DC
Start: ? — End: 2018-01-20

## 2018-01-20 MED ORDER — CITALOPRAM HYDROBROMIDE 20 MG PO TABS
20.00 | ORAL_TABLET | ORAL | Status: DC
Start: 2018-01-21 — End: 2018-01-20

## 2018-01-20 MED ORDER — ALBUTEROL SULFATE (2.5 MG/3ML) 0.083% IN NEBU
2.50 | INHALATION_SOLUTION | RESPIRATORY_TRACT | Status: DC
Start: 2018-01-20 — End: 2018-01-20

## 2018-01-20 MED ORDER — GUAIFENESIN 100 MG/5ML PO SYRP
200.00 | ORAL_SOLUTION | ORAL | Status: DC
Start: ? — End: 2018-01-20

## 2018-01-20 MED ORDER — GENERIC EXTERNAL MEDICATION
Status: DC
Start: 2018-01-21 — End: 2018-01-20

## 2018-01-20 MED ORDER — TRAMADOL HCL 50 MG PO TABS
50.00 | ORAL_TABLET | ORAL | Status: DC
Start: ? — End: 2018-01-20

## 2018-01-20 MED ORDER — ACETAMINOPHEN 500 MG PO TABS
500.00 | ORAL_TABLET | ORAL | Status: DC
Start: 2018-01-20 — End: 2018-01-20

## 2018-01-20 MED ORDER — ROPINIROLE HCL 2 MG PO TABS
2.00 | ORAL_TABLET | ORAL | Status: DC
Start: 2018-01-20 — End: 2018-01-20

## 2018-01-20 MED ORDER — AMLODIPINE BESYLATE 5 MG PO TABS
5.00 | ORAL_TABLET | ORAL | Status: DC
Start: 2018-01-21 — End: 2018-01-20

## 2018-01-20 MED ORDER — LORAZEPAM 0.5 MG PO TABS
0.25 | ORAL_TABLET | ORAL | Status: DC
Start: 2018-01-20 — End: 2018-01-20

## 2018-01-20 MED ORDER — ALBUTEROL SULFATE (2.5 MG/3ML) 0.083% IN NEBU
2.50 | INHALATION_SOLUTION | RESPIRATORY_TRACT | Status: DC
Start: ? — End: 2018-01-20

## 2018-01-20 MED ORDER — GENERIC EXTERNAL MEDICATION
2.00 | Status: DC
Start: ? — End: 2018-01-20

## 2018-01-20 MED ORDER — POLYETHYLENE GLYCOL 3350 17 G PO PACK
17.00 | PACK | ORAL | Status: DC
Start: 2018-01-21 — End: 2018-01-20

## 2018-01-23 DIAGNOSIS — R5381 Other malaise: Secondary | ICD-10-CM | POA: Diagnosis not present

## 2018-01-25 DIAGNOSIS — R5381 Other malaise: Secondary | ICD-10-CM | POA: Diagnosis not present

## 2018-01-27 DIAGNOSIS — R5381 Other malaise: Secondary | ICD-10-CM | POA: Diagnosis not present

## 2018-02-03 DIAGNOSIS — R5381 Other malaise: Secondary | ICD-10-CM | POA: Diagnosis not present

## 2018-02-07 ENCOUNTER — Ambulatory Visit: Payer: Medicare Other | Admitting: Internal Medicine

## 2018-02-13 ENCOUNTER — Telehealth: Payer: Self-pay

## 2018-02-13 NOTE — Telephone Encounter (Signed)
Asa SaunasMary Olivia Wilson from [facility] called just as FYI letting Dr Judithann GravesBerglund know she is going out to evaluate the patient today for Home Health.   CB# [phone]

## 2018-02-20 NOTE — Telephone Encounter (Signed)
error 

## 2018-03-18 ENCOUNTER — Other Ambulatory Visit: Payer: Self-pay | Admitting: Internal Medicine

## 2018-03-18 DIAGNOSIS — F411 Generalized anxiety disorder: Secondary | ICD-10-CM

## 2018-05-05 ENCOUNTER — Other Ambulatory Visit: Payer: Self-pay

## 2018-05-05 ENCOUNTER — Ambulatory Visit
Admission: RE | Admit: 2018-05-05 | Discharge: 2018-05-05 | Disposition: A | Discharge status: Home / Self Care | Payer: Medicare Other | Attending: Internal Medicine | Admitting: Internal Medicine

## 2018-05-05 ENCOUNTER — Encounter: Payer: Self-pay | Admitting: Internal Medicine

## 2018-05-05 ENCOUNTER — Ambulatory Visit (INDEPENDENT_AMBULATORY_CARE_PROVIDER_SITE_OTHER): Payer: Medicare Other | Admitting: Internal Medicine

## 2018-05-05 ENCOUNTER — Ambulatory Visit
Admission: RE | Admit: 2018-05-05 | Discharge: 2018-05-05 | Disposition: A | Discharge status: Home / Self Care | Payer: Medicare Other | Source: Ambulatory Visit | Attending: Internal Medicine | Admitting: Internal Medicine

## 2018-05-05 VITALS — BP 98/72 | HR 95 | Temp 99.4°F | Ht 60.0 in | Wt 104.0 lb

## 2018-05-05 DIAGNOSIS — R059 Cough, unspecified: Secondary | ICD-10-CM

## 2018-05-05 DIAGNOSIS — R6889 Other general symptoms and signs: Secondary | ICD-10-CM

## 2018-05-05 DIAGNOSIS — R05 Cough: Secondary | ICD-10-CM | POA: Insufficient documentation

## 2018-05-05 LAB — POCT INFLUENZA A/B
Influenza A, POC: NEGATIVE
Influenza B, POC: NEGATIVE

## 2018-05-05 MED ORDER — OSELTAMIVIR PHOSPHATE 75 MG PO CAPS: 75.0000 mg | ORAL_CAPSULE | Freq: Two times a day (BID) | ORAL | 0 refills | Status: AC

## 2018-05-05 MED ORDER — BENZONATATE 100 MG PO CAPS: 100.0000 mg | ORAL_CAPSULE | Freq: Three times a day (TID) | ORAL | 0 refills | Status: AC

## 2018-05-05 MED ORDER — DOXYCYCLINE HYCLATE 100 MG PO TABS: 100.0000 mg | ORAL_TABLET | Freq: Two times a day (BID) | ORAL | 0 refills | Status: AC

## 2018-05-05 NOTE — Progress Notes (Signed)
Date:  05/05/2018   Name:  Joan Singleton   DOB:  [birth year]   MRN:  [id number]   Chief Complaint: Cough (Cough, weakness, no fever. Caught from her caregiver. Caregiver may have had the flu))  Cough  This is a new problem. The current episode started yesterday. The problem has been unchanged. The problem occurs every few minutes. Cough characteristics: loose. Associated symptoms include a fever, a sore throat and shortness of breath. Pertinent negatives include no chest pain, chills, ear pain, headaches, nasal congestion, postnasal drip, rash or wheezing. Nothing aggravates the symptoms.    Review of Systems  Constitutional: Positive for fatigue and fever. Negative for appetite change, chills and diaphoresis.  HENT: Positive for sore throat. Negative for ear pain, postnasal drip and trouble swallowing.   Eyes: Negative for visual disturbance.  Respiratory: Positive for cough and shortness of breath. Negative for chest tightness and wheezing.   Cardiovascular: Negative for chest pain and palpitations.  Gastrointestinal: Negative for diarrhea, nausea and vomiting.  Genitourinary: Negative for difficulty urinating.  Skin: Negative for rash.  Neurological: Negative for dizziness and headaches.  Hematological: Negative for adenopathy.  Psychiatric/Behavioral: Negative for sleep disturbance. The patient is not nervous/anxious.     Patient Active Problem List   Diagnosis Date Noted  . Dementia (HCC) 01/14/2018  . Difficulty walking 09/09/2017  . Weight loss, unintentional 04/22/2017  . Restless leg syndrome 05/25/2016  . Arthritis urica 08/23/2014  . Back pain, chronic 08/23/2014  . Chronic constipation 08/23/2014  . Essential (primary) hypertension 08/23/2014  . Gastroesophageal reflux disease 08/23/2014  . Anxiety, generalized 08/23/2014  . Generalized OA 08/23/2014  . OP (osteoporosis) 08/23/2014  . Urge incontinence 08/23/2014  . Tobacco use disorder, mild, in sustained  remission 08/23/2014    No Known Allergies  Past Surgical History:  Procedure Laterality Date  . COLONOSCOPY  2000   normal  . EXPLORATORY LAPAROTOMY  2008   adhesions    Social History   Tobacco Use  . Smoking status: Former Games developer  . Smokeless tobacco: Never Used  Substance Use Topics  . Alcohol use: Yes    Alcohol/week: 2.0 standard drinks    Types: 2 Standard drinks or equivalent per week  . Drug use: No     Medication list has been reviewed and updated.  Current Meds  Medication Sig  . acetaminophen (TYLENOL) 500 MG tablet Take by mouth.  Marland Kitchen amLODipine (NORVASC) 5 MG tablet Take by mouth.  . citalopram (CELEXA) 20 MG tablet TAKE 1 BY MOUTH DAILY  . colchicine (COLCRYS) 0.6 MG tablet Reported on 02/10/2015  . lisinopril-hydrochlorothiazide (PRINZIDE,ZESTORETIC) 20-25 MG tablet TAKE 1 BY MOUTH DAILY  . meclizine (ANTIVERT) 25 MG tablet TAKE 1 TABLET(25 MG) BY MOUTH THREE TIMES DAILY AS NEEDED FOR DIZZINESS  . metoprolol succinate (TOPROL-XL) 50 MG 24 hr tablet Take 1 tablet (50 mg total) by mouth daily. Take with or immediately following a meal.  . omeprazole (PRILOSEC) 20 MG capsule TAKE 1 BY MOUTH DAILY  . oxybutynin (DITROPAN) 5 MG tablet TAKE 1 BY MOUTH 3 TIMES DAILY  . rOPINIRole (REQUIP) 1 MG tablet TAKE 2 TABLETS BY MOUTH AT BEDTIME  . traMADol (ULTRAM) 50 MG tablet TAKE 2 BY MOUTH FOUR TIMES DAILY    PHQ 2/9 Scores 05/05/2018 01/11/2018 11/03/2016 10/03/2015  PHQ - 2 Score 0 0 0 0    Physical Exam Constitutional:      Appearance: Normal appearance.  HENT:     Nose:  Right Sinus: No maxillary sinus tenderness.     Left Sinus: No maxillary sinus tenderness.     Mouth/Throat:     Pharynx: No posterior oropharyngeal erythema.  Neck:     Musculoskeletal: No muscular tenderness.  Cardiovascular:     Rate and Rhythm: Normal rate and regular rhythm.  No extrasystoles are present. Pulmonary:     Effort: Pulmonary effort is normal.     Breath sounds:  Decreased air movement present. No wheezing or rhonchi.  Lymphadenopathy:     Cervical: No cervical adenopathy.  Neurological:     Mental Status: She is lethargic.     Wt Readings from Last 3 Encounters:  05/05/18 104 lb (47.2 kg)  04/22/17 104 lb (47.2 kg)  05/25/16 125 lb (56.7 kg)    BP 98/72   Pulse 95   Temp 99.4 F (37.4 C) (Oral)   Ht 5' (1.524 m)   Wt 104 lb (47.2 kg)   SpO2 95%   BMI 20.31 kg/m   Assessment and Plan: 1. Flu-like symptoms Flu test negative but will treat presumptively - POCT Influenza A/B - oseltamivir (TAMIFLU) 75 MG capsule; Take 1 capsule (75 mg total) by mouth 2 (two) times daily.  Dispense: 10 capsule; Refill: 0  2. Cough Begin antibiotics and cough suppressants CXR to rule out early pneumonia - benzonatate (TESSALON) 100 MG capsule; Take 1 capsule (100 mg total) by mouth 3 (three) times daily for 10 days.  Dispense: 30 capsule; Refill: 0 - doxycycline (VIBRA-TABS) 100 MG tablet; Take 1 tablet (100 mg total) by mouth 2 (two) times daily for 10 days.  Dispense: 20 tablet; Refill: 0 - DG Chest 2 View; Future   Partially dictated using Animal nutritionist. Any errors are unintentional.  Bari Edward, MD [facility]  05/05/2018

## 2018-12-24 DEATH — deceased

## 2020-08-27 IMAGING — CR CHEST - 2 VIEW
2 series · 2 of 2 positions shown · non-contrast
Comparison: 05/30/2011

CLINICAL DATA: Cough and fever

EXAM:
CHEST - 2 VIEW

[chest pa]
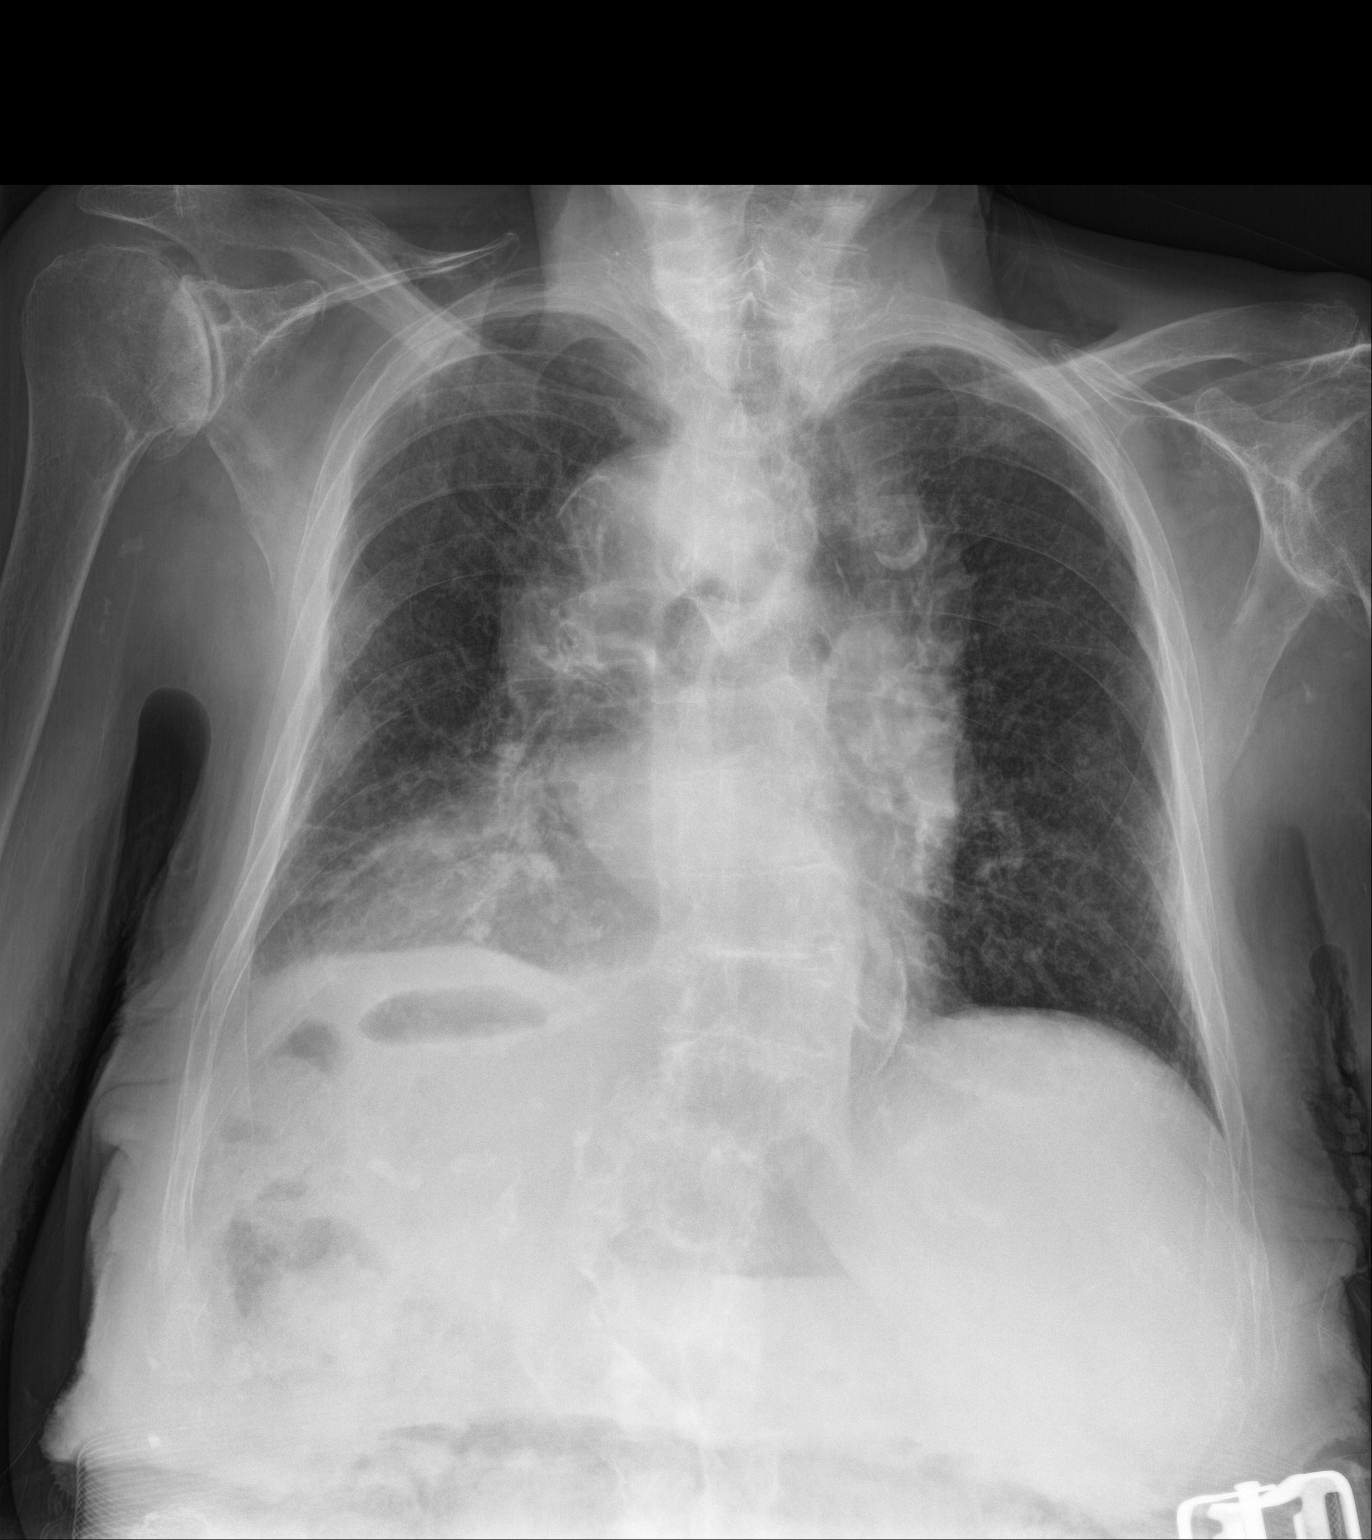

[chest lat]
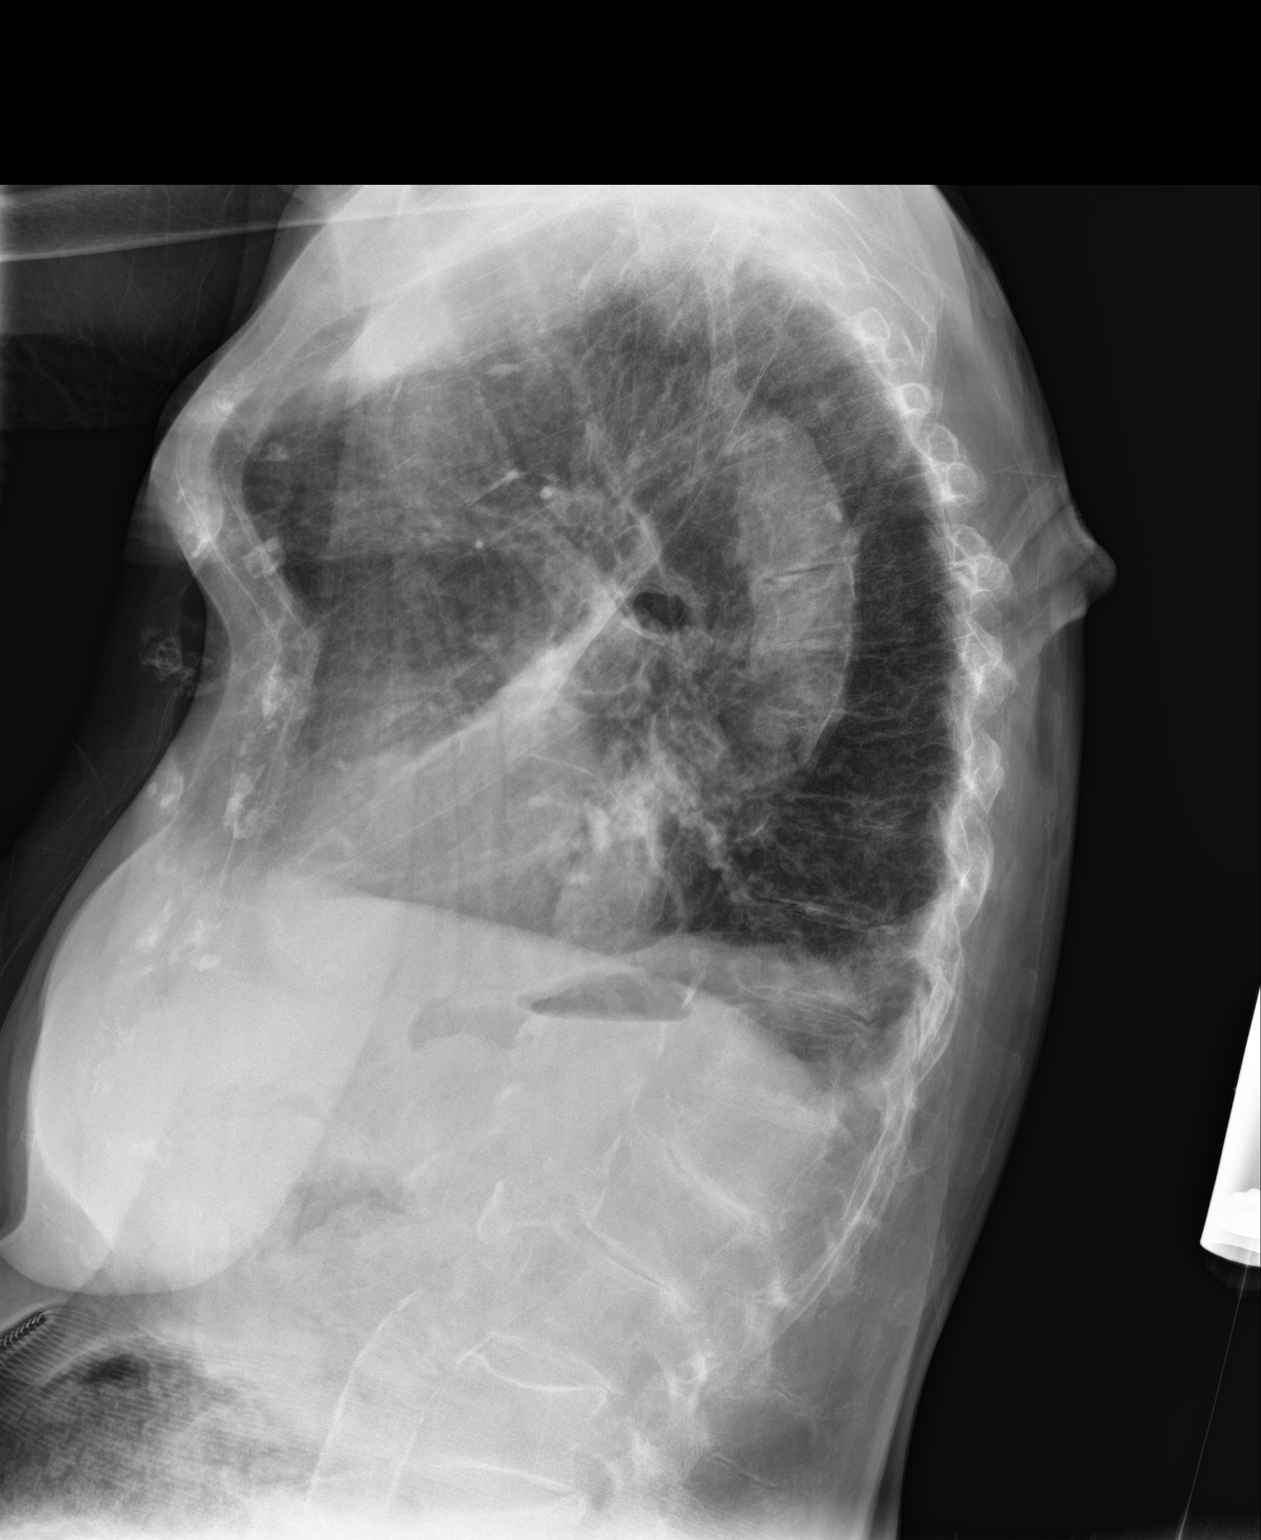

[2 of 2 positions shown; findings below may reference images not displayed]

FINDINGS: The heart size is enlarged. Tortuous, calcified thoracic aorta is
noted. Left pleural effusion. No edema. Pulmonary vascular
congestion. No airspace opacity. There is an age indeterminate
fracture deformity involving the mid body of sternum with posterior
angulation. New from 0254.
IMPRESSION: 1. Mild CHF.
2.  Aortic Atherosclerosis (PVDXC-TA1.1).
3. Age-indeterminate mid body of sternum fracture.
